# Patient Record
Sex: Male | Born: 1935 | Race: Black or African American | Hispanic: No | Marital: Single
Health system: Southern US, Community
[De-identification: ages and names within clinical notes are randomized; demographics above are authoritative.]

## PROBLEM LIST (undated history)

## (undated) DIAGNOSIS — R1310 Dysphagia, unspecified: Secondary | ICD-10-CM

## (undated) DIAGNOSIS — I10 Essential (primary) hypertension: Secondary | ICD-10-CM

## (undated) DIAGNOSIS — K219 Gastro-esophageal reflux disease without esophagitis: Secondary | ICD-10-CM

## (undated) DIAGNOSIS — F0390 Unspecified dementia without behavioral disturbance: Secondary | ICD-10-CM

## (undated) DIAGNOSIS — Z931 Gastrostomy status: Secondary | ICD-10-CM

## (undated) DIAGNOSIS — N390 Urinary tract infection, site not specified: Secondary | ICD-10-CM

## (undated) DIAGNOSIS — I2510 Atherosclerotic heart disease of native coronary artery without angina pectoris: Secondary | ICD-10-CM

---

## 2014-04-24 ENCOUNTER — Ambulatory Visit: Payer: Self-pay

## 2015-08-26 ENCOUNTER — Emergency Department (HOSPITAL_COMMUNITY): Payer: Medicare Other

## 2015-08-26 ENCOUNTER — Observation Stay (HOSPITAL_COMMUNITY): Payer: Medicare Other

## 2015-08-26 ENCOUNTER — Encounter (HOSPITAL_COMMUNITY): Payer: Self-pay | Admitting: *Deleted

## 2015-08-26 ENCOUNTER — Inpatient Hospital Stay (HOSPITAL_COMMUNITY)
Admission: EM | Admit: 2015-08-26 | Discharge: 2015-09-03 | DRG: 871 | Disposition: A | Discharge status: Skilled Nursing Facility | Payer: Medicare Other | Attending: Internal Medicine | Admitting: Internal Medicine

## 2015-08-26 DIAGNOSIS — R571 Hypovolemic shock: Secondary | ICD-10-CM | POA: Diagnosis present

## 2015-08-26 DIAGNOSIS — K922 Gastrointestinal hemorrhage, unspecified: Secondary | ICD-10-CM | POA: Diagnosis present

## 2015-08-26 DIAGNOSIS — N179 Acute kidney failure, unspecified: Secondary | ICD-10-CM | POA: Diagnosis present

## 2015-08-26 DIAGNOSIS — E876 Hypokalemia: Secondary | ICD-10-CM | POA: Diagnosis present

## 2015-08-26 DIAGNOSIS — R778 Other specified abnormalities of plasma proteins: Secondary | ICD-10-CM

## 2015-08-26 DIAGNOSIS — A419 Sepsis, unspecified organism: Secondary | ICD-10-CM | POA: Diagnosis present

## 2015-08-26 DIAGNOSIS — I129 Hypertensive chronic kidney disease with stage 1 through stage 4 chronic kidney disease, or unspecified chronic kidney disease: Secondary | ICD-10-CM | POA: Diagnosis present

## 2015-08-26 DIAGNOSIS — Z681 Body mass index (BMI) 19 or less, adult: Secondary | ICD-10-CM

## 2015-08-26 DIAGNOSIS — E872 Acidosis, unspecified: Secondary | ICD-10-CM

## 2015-08-26 DIAGNOSIS — F0390 Unspecified dementia without behavioral disturbance: Secondary | ICD-10-CM | POA: Diagnosis present

## 2015-08-26 DIAGNOSIS — I959 Hypotension, unspecified: Secondary | ICD-10-CM | POA: Diagnosis present

## 2015-08-26 DIAGNOSIS — N390 Urinary tract infection, site not specified: Secondary | ICD-10-CM | POA: Diagnosis present

## 2015-08-26 DIAGNOSIS — L89152 Pressure ulcer of sacral region, stage 2: Secondary | ICD-10-CM | POA: Diagnosis present

## 2015-08-26 DIAGNOSIS — L89022 Pressure ulcer of left elbow, stage 2: Secondary | ICD-10-CM | POA: Diagnosis present

## 2015-08-26 DIAGNOSIS — T501X5A Adverse effect of loop [high-ceiling] diuretics, initial encounter: Secondary | ICD-10-CM | POA: Diagnosis present

## 2015-08-26 DIAGNOSIS — J90 Pleural effusion, not elsewhere classified: Secondary | ICD-10-CM

## 2015-08-26 DIAGNOSIS — D649 Anemia, unspecified: Secondary | ICD-10-CM | POA: Diagnosis not present

## 2015-08-26 DIAGNOSIS — Z66 Do not resuscitate: Secondary | ICD-10-CM | POA: Diagnosis present

## 2015-08-26 DIAGNOSIS — N189 Chronic kidney disease, unspecified: Secondary | ICD-10-CM | POA: Diagnosis present

## 2015-08-26 DIAGNOSIS — R7989 Other specified abnormal findings of blood chemistry: Secondary | ICD-10-CM

## 2015-08-26 DIAGNOSIS — I2510 Atherosclerotic heart disease of native coronary artery without angina pectoris: Secondary | ICD-10-CM | POA: Diagnosis present

## 2015-08-26 DIAGNOSIS — L89012 Pressure ulcer of right elbow, stage 2: Secondary | ICD-10-CM | POA: Diagnosis present

## 2015-08-26 DIAGNOSIS — E440 Moderate protein-calorie malnutrition: Secondary | ICD-10-CM | POA: Diagnosis present

## 2015-08-26 DIAGNOSIS — Z7401 Bed confinement status: Secondary | ICD-10-CM | POA: Diagnosis not present

## 2015-08-26 DIAGNOSIS — J948 Other specified pleural conditions: Secondary | ICD-10-CM | POA: Diagnosis present

## 2015-08-26 DIAGNOSIS — E875 Hyperkalemia: Secondary | ICD-10-CM | POA: Diagnosis present

## 2015-08-26 DIAGNOSIS — Z9049 Acquired absence of other specified parts of digestive tract: Secondary | ICD-10-CM | POA: Diagnosis not present

## 2015-08-26 DIAGNOSIS — J939 Pneumothorax, unspecified: Secondary | ICD-10-CM | POA: Diagnosis present

## 2015-08-26 DIAGNOSIS — D62 Acute posthemorrhagic anemia: Secondary | ICD-10-CM | POA: Diagnosis present

## 2015-08-26 DIAGNOSIS — E860 Dehydration: Secondary | ICD-10-CM | POA: Diagnosis present

## 2015-08-26 DIAGNOSIS — Z931 Gastrostomy status: Secondary | ICD-10-CM | POA: Diagnosis not present

## 2015-08-26 DIAGNOSIS — K219 Gastro-esophageal reflux disease without esophagitis: Secondary | ICD-10-CM | POA: Diagnosis present

## 2015-08-26 DIAGNOSIS — L8990 Pressure ulcer of unspecified site, unspecified stage: Secondary | ICD-10-CM | POA: Diagnosis present

## 2015-08-26 HISTORY — DX: Gastrostomy status: Z93.1

## 2015-08-26 HISTORY — DX: Urinary tract infection, site not specified: N39.0

## 2015-08-26 HISTORY — DX: Atherosclerotic heart disease of native coronary artery without angina pectoris: I25.10

## 2015-08-26 HISTORY — DX: Gastro-esophageal reflux disease without esophagitis: K21.9

## 2015-08-26 HISTORY — DX: Dysphagia, unspecified: R13.10

## 2015-08-26 HISTORY — DX: Essential (primary) hypertension: I10

## 2015-08-26 HISTORY — DX: Unspecified dementia, unspecified severity, without behavioral disturbance, psychotic disturbance, mood disturbance, and anxiety: F03.90

## 2015-08-26 LAB — CBC WITH DIFFERENTIAL/PLATELET
Basophils Absolute: 0 10*3/uL (ref 0.0–0.1)
Basophils Relative: 0 %
EOS PCT: 0 %
Eosinophils Absolute: 0 10*3/uL (ref 0.0–0.7)
HEMATOCRIT: 14 % — AB (ref 39.0–52.0)
Hemoglobin: 4.5 g/dL — CL (ref 13.0–17.0)
LYMPHS ABS: 0.8 10*3/uL (ref 0.7–4.0)
Lymphocytes Relative: 6 %
MCH: 26.9 pg (ref 26.0–34.0)
MCHC: 32.1 g/dL (ref 30.0–36.0)
MCV: 83.8 fL (ref 78.0–100.0)
MONO ABS: 1.4 10*3/uL — AB (ref 0.1–1.0)
MONOS PCT: 11 %
NEUTROS ABS: 10.7 10*3/uL — AB (ref 1.7–7.7)
Neutrophils Relative %: 83 %
PLATELETS: 260 10*3/uL (ref 150–400)
RBC: 1.67 MIL/uL — ABNORMAL LOW (ref 4.22–5.81)
RDW: 17.9 % — AB (ref 11.5–15.5)
WBC: 12.9 10*3/uL — AB (ref 4.0–10.5)

## 2015-08-26 LAB — COMPREHENSIVE METABOLIC PANEL
ALT: 23 U/L (ref 17–63)
ANION GAP: 17 — AB (ref 5–15)
AST: 43 U/L — ABNORMAL HIGH (ref 15–41)
Albumin: 2.6 g/dL — ABNORMAL LOW (ref 3.5–5.0)
Alkaline Phosphatase: 60 U/L (ref 38–126)
BILIRUBIN TOTAL: 0.7 mg/dL (ref 0.3–1.2)
BUN: 103 mg/dL — AB (ref 6–20)
CHLORIDE: 96 mmol/L — AB (ref 101–111)
CO2: 22 mmol/L (ref 22–32)
Calcium: 8.1 mg/dL — ABNORMAL LOW (ref 8.9–10.3)
Creatinine, Ser: 3.03 mg/dL — ABNORMAL HIGH (ref 0.61–1.24)
GFR, EST AFRICAN AMERICAN: 20 mL/min — AB (ref >60)
GFR, EST NON AFRICAN AMERICAN: 17 mL/min — AB (ref >60)
Glucose, Bld: 183 mg/dL — ABNORMAL HIGH (ref 65–99)
POTASSIUM: 6.3 mmol/L — AB (ref 3.5–5.1)
Sodium: 135 mmol/L (ref 135–145)
TOTAL PROTEIN: 5.5 g/dL — AB (ref 6.5–8.1)

## 2015-08-26 LAB — I-STAT CG4 LACTIC ACID, ED
LACTIC ACID, VENOUS: 7.04 mmol/L — AB (ref 0.5–2.0)
Lactic Acid, Venous: 9.35 mmol/L (ref 0.5–2.0)

## 2015-08-26 LAB — URINE MICROSCOPIC-ADD ON

## 2015-08-26 LAB — URINALYSIS, ROUTINE W REFLEX MICROSCOPIC
BILIRUBIN URINE: NEGATIVE
GLUCOSE, UA: NEGATIVE mg/dL
Ketones, ur: NEGATIVE mg/dL
Nitrite: NEGATIVE
Protein, ur: 100 mg/dL — AB
SPECIFIC GRAVITY, URINE: 1.01 (ref 1.005–1.030)
pH: 6.5 (ref 5.0–8.0)

## 2015-08-26 LAB — ABO/RH: ABO/RH(D): A POS

## 2015-08-26 LAB — BRAIN NATRIURETIC PEPTIDE: B Natriuretic Peptide: 2548 pg/mL — ABNORMAL HIGH (ref 0.0–100.0)

## 2015-08-26 LAB — PREPARE RBC (CROSSMATCH)

## 2015-08-26 LAB — I-STAT TROPONIN, ED: TROPONIN I, POC: 0.62 ng/mL — AB (ref 0.00–0.08)

## 2015-08-26 LAB — POC OCCULT BLOOD, ED: FECAL OCCULT BLD: POSITIVE — AB

## 2015-08-26 LAB — LIPASE, BLOOD: LIPASE: 17 U/L (ref 11–51)

## 2015-08-26 MED ORDER — PANTOPRAZOLE SODIUM 40 MG IV SOLR
40.0000 mg | Freq: Two times a day (BID) | INTRAVENOUS | Status: DC
  Administered 2015-08-30 – 2015-09-03 (×9): 40 mg via INTRAVENOUS
  Filled 2015-08-26 (×9): qty 40

## 2015-08-26 MED ORDER — BACITRACIN ZINC 500 UNIT/GM EX OINT
TOPICAL_OINTMENT | CUTANEOUS | Status: AC
  Administered 2015-08-26: 23:00:00
  Filled 2015-08-26: qty 0.9

## 2015-08-26 MED ORDER — SODIUM CHLORIDE 0.9 % IV BOLUS (SEPSIS)
1000.0000 mL | Freq: Once | INTRAVENOUS | Status: AC
  Administered 2015-08-26: 1000 mL via INTRAVENOUS

## 2015-08-26 MED ORDER — SODIUM CHLORIDE 0.9 % IV SOLN
10.0000 mL/h | Freq: Once | INTRAVENOUS | Status: AC
  Administered 2015-08-26: 10 mL/h via INTRAVENOUS

## 2015-08-26 MED ORDER — SODIUM CHLORIDE 0.9 % IV SOLN
INTRAVENOUS | Status: DC
  Administered 2015-08-27 – 2015-08-30 (×3): via INTRAVENOUS

## 2015-08-26 MED ORDER — PIPERACILLIN-TAZOBACTAM 3.375 G IVPB 30 MIN
3.3750 g | Freq: Once | INTRAVENOUS | Status: AC
  Administered 2015-08-26: 3.375 g via INTRAVENOUS
  Filled 2015-08-26: qty 50

## 2015-08-26 MED ORDER — PANTOPRAZOLE SODIUM 40 MG IV SOLR
INTRAVENOUS | Status: AC
  Filled 2015-08-26: qty 160

## 2015-08-26 MED ORDER — ONDANSETRON HCL 4 MG PO TABS: 4.0000 mg | ORAL_TABLET | Freq: Four times a day (QID) | ORAL | Status: DC | PRN

## 2015-08-26 MED ORDER — DIATRIZOATE MEGLUMINE & SODIUM 66-10 % PO SOLN
ORAL | Status: AC
  Administered 2015-08-26: 23:00:00
  Filled 2015-08-26: qty 30

## 2015-08-26 MED ORDER — SODIUM CHLORIDE 0.9 % IV BOLUS (SEPSIS): 1000.0000 mL | Freq: Once | INTRAVENOUS | Status: DC

## 2015-08-26 MED ORDER — SODIUM CHLORIDE 0.9 % IV SOLN
8.0000 mg/h | INTRAVENOUS | Status: AC
  Administered 2015-08-27 – 2015-08-28 (×5): 8 mg/h via INTRAVENOUS
  Filled 2015-08-26 (×8): qty 80

## 2015-08-26 MED ORDER — ONDANSETRON HCL 4 MG/2ML IJ SOLN: 4.0000 mg | Freq: Four times a day (QID) | INTRAMUSCULAR | Status: DC | PRN

## 2015-08-26 MED ORDER — SODIUM CHLORIDE 0.9 % IV SOLN
80.0000 mg | Freq: Once | INTRAVENOUS | Status: DC
  Administered 2015-08-27: 80 mg via INTRAVENOUS
  Filled 2015-08-26: qty 80

## 2015-08-26 MED ORDER — VANCOMYCIN HCL IN DEXTROSE 1-5 GM/200ML-% IV SOLN
1000.0000 mg | Freq: Once | INTRAVENOUS | Status: AC
  Administered 2015-08-26: 1000 mg via INTRAVENOUS
  Filled 2015-08-26: qty 200

## 2015-08-26 NOTE — ED Notes (Signed)
CRITICAL VALUE ALERT  Critical value received:  Hgb4.5  Date of notification:  08/26/2015  Time of notification:  2011  Critical value read back:Yes.    Nurse who received alert:  Neldon Mc RN  MD notified (1st page):  Dr. Verdie Mosher  Time of first page:  2012

## 2015-08-26 NOTE — ED Provider Notes (Signed)
CSN: [id number]     Arrival date & time 08/26/15  1810 History   First MD Initiated Contact with Patient 08/26/15 1821     Chief Complaint  Patient presents with  . Hypotension     (Consider location/radiation/quality/duration/timing/severity/associated sxs/prior Treatment) HPI Level 5 Caveat due to dementia. 82 year old male with history of dementia, CAD, hypertension and dysphasia with gastrostomy tube who presents with fecal output from his G-tube. History obtained from nursing facility at [facility] or patient comes from. They state that he has been having some G-tube output of tube feedings several days ago. However, over the course of the past 2 days he's been having increased g-tube output and today seems to be of feculent material and blood-tinged he sees. He has had lower blood pressures and decreased alertness from baseline. No appreciable fevers, nausea or vomiting, coughing or difficulty breathing. According to nursing facility. Patient is full code. Past Medical History  Diagnosis Date  . Hypertension   . Coronary artery disease   . Dementia   . GERD (gastroesophageal reflux disease)   . UTI (lower urinary tract infection)   . Gastrostomy in place [facility])   . Dysphagia    History reviewed. No pertinent past surgical history. History reviewed. No pertinent family history. Social History  Substance Use Topics  . Smoking status: Unknown If Ever Smoked  . Smokeless tobacco: None  . Alcohol Use: No    Review of Systems  Unable to perform ROS: Dementia      Allergies  Review of patient's allergies indicates no known allergies.  Home Medications   Prior to Admission medications   Medication Sig Start Date End Date Taking? Authorizing Provider  collagenase (SANTYL) ointment Apply 1 application topically daily.   Yes Historical Provider, MD  LORazepam (ATIVAN) 1 MG tablet Take 1 mg by mouth 2 (two) times daily. For agitation   Yes Historical Provider, MD  morphine 10  MG/5ML solution Take 5 mg by mouth every 4 (four) hours as needed for moderate pain or severe pain.   Yes Historical Provider, MD  Nutritional Supplements (FEEDING SUPPLEMENT, GLUCERNA 1.2 CAL,) LIQD Place 50 mLs into feeding tube continuous. 46ml/hr every shift for supplement   Yes Historical Provider, MD  OXYGEN Inhale 2 L into the lungs continuous.   Yes Historical Provider, MD  scopolamine (TRANSDERM-SCOP, 1.5 MG,) 1 MG/3DAYS Place 1 patch onto the skin See admin instructions. Apply every 72 hours as needed for for secretions   Yes Historical Provider, MD   BP 134/87 mmHg  Pulse 86  Temp(Src) 96.7 F (35.9 C) (Axillary)  Resp 19  Ht  (1.676 m)  Wt 114 lb 10.2 oz (52 kg)  BMI 18.51 kg/m2  SpO2 100% Physical Exam Physical Exam  Nursing note and vitals reviewed. Constitutional: Chronically and acutely ill appearing, cachectic Head: Normocephalic and atraumatic.  Mouth/Throat: Oropharynx is dry.  Neck:  Neck supple.  Cardiovascular: Tachycardic rate and regular rhythm.  No edema. Pulmonary/Chest: Effort normal. Diminished breath sounds over the right lung. Abdominal: Soft. G-tube in LUQ, with dark brown and blood tinge leakage around tube. in GU: in place suprapubic catheter.   Clear tube feeds when aspirated from G-tubee. There is no rebound and no guarding.  Musculoskeletal: No deformities  Neurological: Lethargic, does not rouse easily to voice but arouses to tactile stimulation, non-verbal, withdraws all extremities to tactile stimuli Skin: Skin is warm and cool. Healing decubitus ulcer.     ED Course  Procedures (including critical care  time) Labs Review Labs Reviewed  MRSA PCR SCREENING - Abnormal; Notable for the following:    MRSA by PCR POSITIVE (*)    All other components within normal limits  URINALYSIS, ROUTINE W REFLEX MICROSCOPIC (NOT AT [facility]) - Abnormal; Notable for the following:    APPearance HAZY (*)    Hgb urine dipstick TRACE (*)    Protein, ur 100 (*)     Leukocytes, UA LARGE (*)    All other components within normal limits  CBC WITH DIFFERENTIAL/PLATELET - Abnormal; Notable for the following:    WBC 12.9 (*)    RBC 1.67 (*)    Hemoglobin 4.5 (*)    HCT 14.0 (*)    RDW 17.9 (*)    Neutro Abs 10.7 (*)    Monocytes Absolute 1.4 (*)    All other components within normal limits  COMPREHENSIVE METABOLIC PANEL - Abnormal; Notable for the following:    Potassium 6.3 (*)    Chloride 96 (*)    Glucose, Bld 183 (*)    BUN 103 (*)    Creatinine, Ser 3.03 (*)    Calcium 8.1 (*)    Total Protein 5.5 (*)    Albumin 2.6 (*)    AST 43 (*)    GFR calc non Af Amer 17 (*)    GFR calc Af Amer 20 (*)    Anion gap 17 (*)    All other components within normal limits  BRAIN NATRIURETIC PEPTIDE - Abnormal; Notable for the following:    B Natriuretic Peptide 2548.0 (*)    All other components within normal limits  URINE MICROSCOPIC-ADD ON - Abnormal; Notable for the following:    Squamous Epithelial / LPF 0-5 (*)    Bacteria, UA FEW (*)    All other components within normal limits  CBC - Abnormal; Notable for the following:    WBC 14.6 (*)    RBC 3.09 (*)    Hemoglobin 9.0 (*)    HCT 26.4 (*)    RDW 16.3 (*)    All other components within normal limits  COMPREHENSIVE METABOLIC PANEL - Abnormal; Notable for the following:    Potassium 6.2 (*)    Chloride 100 (*)    CO2 20 (*)    Glucose, Bld 157 (*)    BUN 102 (*)    Creatinine, Ser 2.93 (*)    Calcium 8.0 (*)    Total Protein 5.5 (*)    Albumin 2.7 (*)    AST 199 (*)    ALT 104 (*)    Total Bilirubin 2.1 (*)    GFR calc non Af Amer 18 (*)    GFR calc Af Amer 21 (*)    Anion gap 16 (*)    All other components within normal limits  LACTIC ACID, PLASMA - Abnormal; Notable for the following:    Lactic Acid, Venous 4.6 (*)    All other components within normal limits  LACTIC ACID, PLASMA - Abnormal; Notable for the following:    Lactic Acid, Venous 2.3 (*)    All other components  within normal limits  I-STAT CG4 LACTIC ACID, ED - Abnormal; Notable for the following:    Lactic Acid, Venous 7.04 (*)    All other components within normal limits  I-STAT TROPOININ, ED - Abnormal; Notable for the following:    Troponin i, poc 0.62 (*)    All other components within normal limits  POC OCCULT BLOOD, ED - Abnormal; Notable for  the following:    Fecal Occult Bld POSITIVE (*)    All other components within normal limits  I-STAT CG4 LACTIC ACID, ED - Abnormal; Notable for the following:    Lactic Acid, Venous 9.35 (*)    All other components within normal limits  CULTURE, BLOOD (ROUTINE X 2)  CULTURE, BLOOD (ROUTINE X 2)  URINE CULTURE  LIPASE, BLOOD  OCCULT BLOOD X 1 CARD TO LAB, STOOL  TYPE AND SCREEN  PREPARE RBC (CROSSMATCH)  ABO/RH    Imaging Review Ct Abdomen Pelvis Wo Contrast  08/26/2015  CLINICAL DATA:  Severe lactic acidosis. Hypotension. Elevated creatinine. Initial encounter. EXAM: CT ABDOMEN AND PELVIS WITHOUT CONTRAST TECHNIQUE: Multidetector CT imaging of the abdomen and pelvis was performed following the standard protocol without IV contrast. COMPARISON:  Chest in two views abdomen earlier today. FINDINGS: The study is somewhat limited due to difficulty positioning the patient. The patient has a large right pleural effusion and right pneumothorax. No left effusion is identified. Heart size is enlarged. There is diffuse body wall and mesenteric edema. A gastrostomy tube is in place. No free intraperitoneal air, portal venous gas or pneumatosis is identified. There is a large volume of stool in the colon. The stomach and small bowel are unremarkable. The patient is status post cholecystectomy. The liver, spleen, adrenal glands and biliary tree are unremarkable. The kidneys appear atrophic bilaterally. Low attenuating lesions in the left kidney are likely cysts. Linear high attenuation focus in the right kidney could be a nonobstructing stone or vascular  calcification. There is marked degenerative disease about the hips and multilevel lumbar spondylosis. No lytic or sclerotic lesion is identified. IMPRESSION: Large right hydropneumothorax. No CT evidence of bowel ischemia is identified. Anasarca. Extensive atherosclerosis. Critical Value/emergent results were called by telephone at the time of interpretation on 08/26/2015 at 11:14 pm to Dr. Crista Curb , who verbally acknowledged these results. Electronically Signed   By: Drusilla Kanner M.D.   On: 08/26/2015 23:14   Dg Abd Acute W/chest  08/26/2015  CLINICAL DATA:  Hypotensive, blood and feces around feeding tube for several days, contracted, hypertension, coronary artery disease, dementia EXAM: DG ABDOMEN ACUTE W/ 1V CHEST COMPARISON:  None. FINDINGS: Enlargement of cardiac silhouette. Elongation of thoracic aorta. LEFT lung clear. Opacification of inferior RIGHT hemi thorax by large probable RIGHT pleural effusion. Significant atelectasis of RIGHT lung. Areas of lucency in the RIGHT hemi thorax may represent loculated pneumothorax or large bullae. Gastrostomy tube mid abdomen. Gas collection under LEFT hemidiaphragm likely represents combination of gas within the stomach and gaseous distention of the distal transverse colon and splenic flexure. No free intraperitoneal air. Paucity of small bowel gas. Diffuse osseous demineralization with degenerative changes of the lumbar spine. Degenerative changes of both hip joints. Scattered vascular calcifications. Questionable nonobstructing RIGHT renal calculus 6 mm diameter. Surgical clips RIGHT upper quadrant question cholecystectomy. IMPRESSION: No definite evidence of bowel obstruction or perforation. Nonspecific gaseous distention of the distal transverse colon and splenic flexure. Large RIGHT pleural effusion with loculated gas collections in the RIGHT hemi thorax which may represent loculated pneumothoraces or less likely bullae; no evidence of mediastinal shift to  suggest tension. RIGHT chest could be better evaluated by CT. Question nonobstructing RIGHT renal calculus. Question prior cholecystectomy. Critical Value/emergent results were called by telephone at the time of interpretation on 08/26/2015 at 7:04 pm to Dr. Crista Curb , who verbally acknowledged these results. Electronically Signed   By: Ulyses Southward M.D.   On: 08/26/2015  19:05   I have personally reviewed and evaluated these images and lab results as part of my medical decision-making.  CRITICAL CARE Performed by: Lavera Guise   Total critical care time: 35 minutes  Critical care time was exclusive of separately billable procedures and treating other patients.  Critical care was necessary to treat or prevent imminent or life-threatening deterioration.  Critical care was time spent personally by me on the following activities: development of treatment plan with patient and/or surrogate as well as nursing, discussions with consultants, evaluation of patient's response to treatment, examination of patient, obtaining history from patient or surrogate, ordering and performing treatments and interventions, ordering and review of laboratory studies, ordering and review of radiographic studies, pulse oximetry and re-evaluation of patient's condition.  MDM   Final diagnoses:  UGIB (upper gastrointestinal bleed)  Acute renal failure, unspecified acute renal failure type (HCC)  Lactic acidosis  Acute blood loss anemia  Hypovolemic shock (HCC)  Pleural effusion on right  Elevated troponin    82 year old with history of dementia and CAD who presents with bloody g-tube drainage and hypotension. Hypotensive on arrival and tachycardic, SBP 80s, responsive to IV fluids. Afebrile and on room air without respiratory distress. Abdomen benign to palpation. Clear material from aspiration of g-tube, rectal exam revealing guaiac positive brown stool.  Blood work concerning for multi-organ failure, with severe  lactic acidosis, troponin leak, and acute renal failure. May be due to hypovolemia from possible GI bleed vs dehydration. Hgb is 4.5 and transfused 2 units of blood. Started on protonix gtt. Empirically covered for sepsis pending cultures, urine, and CXR given severely elevated lactic and leukocytosis, with vanc and zosyn. CXR showing hydroPTX of unclear origin and no prior chest imaging to compare.   Discussed with Dr. Darrick Penna from GI who will evaluate patient. Discussed with Dr. Sharl Ma who will admit stepdown.    Lavera Guise, MD 08/27/15 [phone]

## 2015-08-26 NOTE — H&P (Signed)
PCP:   No primary care provider on file.   Chief Complaint:  Hypotension  HPI: 82 year old male who   has a past medical history of Hypertension; Coronary artery disease; Dementia; GERD (gastroesophageal reflux disease); UTI (lower urinary tract infection); Gastrostomy in place [facility]); and Dysphagia. Patient resides at skilled facility at [facility], and was sent to the ED for hypotension and increased G-tube output for the past few days. G-tube output seemed to have feculent material and blood-tinged discharge. Patient also was found to be hypotensive. Patient unable to provide any history due to dementia. But as per the ED notes there was no fever no nausea vomiting no shortness of breath. In the ED patient was found to be anemic with hemoglobin 4.5. Stool guaiac was positive. Lactate initial 7.04, repeat lactate 9.35 Abdominal x-ray shows no free intraperitoneal air.  Allergies:  No Known Allergies    Past Medical History  Diagnosis Date  . Hypertension   . Coronary artery disease   . Dementia   . GERD (gastroesophageal reflux disease)   . UTI (lower urinary tract infection)   . Gastrostomy in place [facility])   . Dysphagia     History reviewed. No pertinent past surgical history.  Prior to Admission medications   Medication Sig Start Date End Date Taking? Authorizing Provider  collagenase (SANTYL) ointment Apply 1 application topically daily.   Yes Historical Provider, MD  LORazepam (ATIVAN) 1 MG tablet Take 1 mg by mouth 2 (two) times daily. For agitation   Yes Historical Provider, MD  morphine 10 MG/5ML solution Take 5 mg by mouth every 4 (four) hours as needed for moderate pain or severe pain.   Yes Historical Provider, MD  Nutritional Supplements (FEEDING SUPPLEMENT, GLUCERNA 1.2 CAL,) LIQD Place 50 mLs into feeding tube continuous. 15ml/hr every shift for supplement   Yes Historical Provider, MD  OXYGEN Inhale 2 L into the lungs continuous.   Yes Historical Provider, MD    scopolamine (TRANSDERM-SCOP, 1.5 MG,) 1 MG/3DAYS Place 1 patch onto the skin See admin instructions. Apply every 72 hours as needed for for secretions   Yes Historical Provider, MD    Social History:  reports that he does not drink alcohol. His tobacco and drug histories are not on file.  All the positives are listed in BOLD  Review of Systems:  As the history of present illness, rest of review of systems unobtainable   Physical Exam: Blood pressure 108/64, pulse 99, temperature 96.9 F (36.1 C), temperature source Rectal, resp. rate 28, SpO2 100 %. Constitutional:   Patient is a cachectic appearing male Head: Normocephalic and atraumatic Mouth: Mucus membranes moist Neck: Supple, No Thyromegaly Cardiovascular: RRR, S1 normal, S2 normal Pulmonary/Chest: CTAB, no wheezes, rales, or rhonchi Abdominal: Soft. Non-tender, mild guarding, non-distended, PEG tube in place, feculent material noted around the PEG tube  Neurological: Alert, not oriented 3. Moving all extremities  Extremities : No Cyanosis, Clubbing or Edema  Labs on Admission:  Basic Metabolic Panel:  Recent Labs Lab 08/26/15 1950  NA 135  K 6.3*  CL 96*  CO2 22  GLUCOSE 183*  BUN 103*  CREATININE 3.03*  CALCIUM 8.1*   Liver Function Tests:  Recent Labs Lab 08/26/15 1950  AST 43*  ALT 23  ALKPHOS 60  BILITOT 0.7  PROT 5.5*  ALBUMIN 2.6*    Recent Labs Lab 08/26/15 1950  LIPASE 17   No results for input(s): AMMONIA in the last 168 hours. CBC:  Recent Labs  Lab 08/26/15 1950  WBC 12.9*  NEUTROABS 10.7*  HGB 4.5*  HCT 14.0*  MCV 83.8  PLT 260   Cardiac Enzymes: No results for input(s): CKTOTAL, CKMB, CKMBINDEX, TROPONINI in the last 168 hours.  BNP (last 3 results)  Recent Labs  08/26/15 1950  BNP 2548.0*     Radiological Exams on Admission: Dg Abd Acute W/chest  08/26/2015  CLINICAL DATA:  Hypotensive, blood and feces around feeding tube for several days, contracted,  hypertension, coronary artery disease, dementia EXAM: DG ABDOMEN ACUTE W/ 1V CHEST COMPARISON:  None. FINDINGS: Enlargement of cardiac silhouette. Elongation of thoracic aorta. LEFT lung clear. Opacification of inferior RIGHT hemi thorax by large probable RIGHT pleural effusion. Significant atelectasis of RIGHT lung. Areas of lucency in the RIGHT hemi thorax may represent loculated pneumothorax or large bullae. Gastrostomy tube mid abdomen. Gas collection under LEFT hemidiaphragm likely represents combination of gas within the stomach and gaseous distention of the distal transverse colon and splenic flexure. No free intraperitoneal air. Paucity of small bowel gas. Diffuse osseous demineralization with degenerative changes of the lumbar spine. Degenerative changes of both hip joints. Scattered vascular calcifications. Questionable nonobstructing RIGHT renal calculus 6 mm diameter. Surgical clips RIGHT upper quadrant question cholecystectomy. IMPRESSION: No definite evidence of bowel obstruction or perforation. Nonspecific gaseous distention of the distal transverse colon and splenic flexure. Large RIGHT pleural effusion with loculated gas collections in the RIGHT hemi thorax which may represent loculated pneumothoraces or less likely bullae; no evidence of mediastinal shift to suggest tension. RIGHT chest could be better evaluated by CT. Question nonobstructing RIGHT renal calculus. Question prior cholecystectomy. Critical Value/emergent results were called by telephone at the time of interpretation on 08/26/2015 at 7:04 pm to Dr. Crista Curb , who verbally acknowledged these results. Electronically Signed   By: Ulyses Southward M.D.   On: 08/26/2015 19:05       Assessment/Plan Active Problems:   GI bleed   Sepsis (HCC)   Dementia   Anemia   Hyperkalemia   UTI  GI bleed Patient is presenting with guaiac-positive stool, anemia with hemoglobin 4.5 Started on Protonix infusion, 2 units of blood transfusion  ordered in the ED GI has been consulted Dr. Darrick Penna to see the patient  Anemia Likely from above, hemoglobin is 4.5 Unknown baseline Will transfuse 2 units PRBC and check CBC in a.m.  Sepsis Patient presented with hypotension, WBC 12.9 Lactate 9.35 Empirically started on vancomycin and Zosyn for possible UTI Follow urine culture  and blood culture results  Hyperkalemia Potassium 6.3, will not give Kayexalate due to hypotension and ongoing diarrhea from PEG tube Follow BMP in a.m.  Dementia  Stable, no behavior disturbance at this time    Code status: Full code  Family discussion: No family present at bedside   Time Spent on Admission: 55 min  Isis Costanza S [facility] Pager: [phone] 08/26/2015, 10:00 PM  If 7PM-7AM, please contact night-coverage  [email or web address]  Password [email or web address]

## 2015-08-26 NOTE — ED Notes (Signed)
CRITICAL VALUE ALERT  Critical value received:  Potassium 6.3  Date of notification:  08/26/15  Time of notification:  2034 hrs  Critical value read back:Yes.    Nurse who received alert:  Y. Daleen Squibb, RN  MD notified (1st page):  Dr. Verdie Mosher  Time of first page:  2035 hrs   Responding MD:  Dr. Verdie Mosher  Time MD responded:  2037 hrs

## 2015-08-26 NOTE — ED Notes (Signed)
Pt came in with foley catheter in place.  

## 2015-08-26 NOTE — ED Notes (Signed)
Pt from [facility], pt hypotensive and reported that feces and blood from around tube feeding

## 2015-08-27 DIAGNOSIS — J939 Pneumothorax, unspecified: Secondary | ICD-10-CM | POA: Diagnosis present

## 2015-08-27 DIAGNOSIS — N179 Acute kidney failure, unspecified: Secondary | ICD-10-CM

## 2015-08-27 DIAGNOSIS — E875 Hyperkalemia: Secondary | ICD-10-CM | POA: Diagnosis present

## 2015-08-27 DIAGNOSIS — K922 Gastrointestinal hemorrhage, unspecified: Secondary | ICD-10-CM

## 2015-08-27 DIAGNOSIS — E440 Moderate protein-calorie malnutrition: Secondary | ICD-10-CM

## 2015-08-27 DIAGNOSIS — D62 Acute posthemorrhagic anemia: Secondary | ICD-10-CM | POA: Diagnosis present

## 2015-08-27 DIAGNOSIS — Z931 Gastrostomy status: Secondary | ICD-10-CM

## 2015-08-27 DIAGNOSIS — F0390 Unspecified dementia without behavioral disturbance: Secondary | ICD-10-CM

## 2015-08-27 LAB — COMPREHENSIVE METABOLIC PANEL
ALBUMIN: 2.7 g/dL — AB (ref 3.5–5.0)
ALK PHOS: 65 U/L (ref 38–126)
ALT: 104 U/L — AB (ref 17–63)
AST: 199 U/L — AB (ref 15–41)
Anion gap: 16 — ABNORMAL HIGH (ref 5–15)
BILIRUBIN TOTAL: 2.1 mg/dL — AB (ref 0.3–1.2)
BUN: 102 mg/dL — AB (ref 6–20)
CO2: 20 mmol/L — ABNORMAL LOW (ref 22–32)
CREATININE: 2.93 mg/dL — AB (ref 0.61–1.24)
Calcium: 8 mg/dL — ABNORMAL LOW (ref 8.9–10.3)
Chloride: 100 mmol/L — ABNORMAL LOW (ref 101–111)
GFR calc Af Amer: 21 mL/min — ABNORMAL LOW (ref >60)
GFR calc non Af Amer: 18 mL/min — ABNORMAL LOW (ref >60)
GLUCOSE: 157 mg/dL — AB (ref 65–99)
POTASSIUM: 6.2 mmol/L — AB (ref 3.5–5.1)
Sodium: 136 mmol/L (ref 135–145)
TOTAL PROTEIN: 5.5 g/dL — AB (ref 6.5–8.1)

## 2015-08-27 LAB — CBC
HEMATOCRIT: 26.4 % — AB (ref 39.0–52.0)
HEMOGLOBIN: 9 g/dL — AB (ref 13.0–17.0)
MCH: 29.1 pg (ref 26.0–34.0)
MCHC: 34.1 g/dL (ref 30.0–36.0)
MCV: 85.4 fL (ref 78.0–100.0)
Platelets: 196 10*3/uL (ref 150–400)
RBC: 3.09 MIL/uL — AB (ref 4.22–5.81)
RDW: 16.3 % — ABNORMAL HIGH (ref 11.5–15.5)
WBC: 14.6 10*3/uL — AB (ref 4.0–10.5)

## 2015-08-27 LAB — LACTIC ACID, PLASMA
Lactic Acid, Venous: 4.6 mmol/L (ref 0.5–2.0)
Lactic Acid, Venous: 2.3 mmol/L (ref 0.5–2.0)

## 2015-08-27 LAB — MRSA PCR SCREENING: MRSA by PCR: POSITIVE — AB

## 2015-08-27 MED ORDER — SODIUM POLYSTYRENE SULFONATE 15 GM/60ML PO SUSP
30.0000 g | Freq: Once | ORAL | Status: AC
  Administered 2015-08-27: 30 g
  Filled 2015-08-27: qty 120

## 2015-08-27 MED ORDER — SODIUM CHLORIDE 0.9 % IV BOLUS (SEPSIS)
1000.0000 mL | Freq: Once | INTRAVENOUS | Status: AC
  Administered 2015-08-27: 1000 mL via INTRAVENOUS

## 2015-08-27 MED ORDER — MUPIROCIN 2 % EX OINT
1.0000 "application " | TOPICAL_OINTMENT | Freq: Two times a day (BID) | CUTANEOUS | Status: AC
  Administered 2015-08-27 – 2015-08-31 (×10): 1 via NASAL
  Filled 2015-08-27 (×2): qty 22

## 2015-08-27 MED ORDER — CETYLPYRIDINIUM CHLORIDE 0.05 % MT LIQD
7.0000 mL | Freq: Two times a day (BID) | OROMUCOSAL | Status: DC
  Administered 2015-08-27 – 2015-09-03 (×14): 7 mL via OROMUCOSAL

## 2015-08-27 MED ORDER — PIPERACILLIN-TAZOBACTAM IN DEX 2-0.25 GM/50ML IV SOLN
2.2500 g | Freq: Three times a day (TID) | INTRAVENOUS | Status: DC
  Filled 2015-08-27 (×4): qty 50

## 2015-08-27 MED ORDER — FUROSEMIDE 10 MG/ML IJ SOLN
40.0000 mg | Freq: Once | INTRAMUSCULAR | Status: AC
  Administered 2015-08-27: 40 mg via INTRAVENOUS
  Filled 2015-08-27: qty 4

## 2015-08-27 MED ORDER — CHLORHEXIDINE GLUCONATE 0.12 % MT SOLN
15.0000 mL | Freq: Two times a day (BID) | OROMUCOSAL | Status: DC
  Administered 2015-08-27 – 2015-09-03 (×13): 15 mL via OROMUCOSAL
  Filled 2015-08-27 (×13): qty 15

## 2015-08-27 MED ORDER — PIPERACILLIN SOD-TAZOBACTAM SO 2.25 (2-0.25) G IV SOLR
2.2500 g | Freq: Three times a day (TID) | INTRAVENOUS | Status: DC
  Administered 2015-08-27 – 2015-08-31 (×13): 2.25 g via INTRAVENOUS
  Filled 2015-08-27 (×16): qty 2.25

## 2015-08-27 MED ORDER — CHLORHEXIDINE GLUCONATE CLOTH 2 % EX PADS
6.0000 | MEDICATED_PAD | Freq: Every day | CUTANEOUS | Status: AC
  Administered 2015-08-27 – 2015-08-31 (×5): 6 via TOPICAL

## 2015-08-27 MED ORDER — PIPERACILLIN-TAZOBACTAM 3.375 G IVPB
3.3750 g | Freq: Once | INTRAVENOUS | Status: AC
  Administered 2015-08-27: 3.375 g via INTRAVENOUS
  Filled 2015-08-27: qty 50

## 2015-08-27 MED ORDER — VANCOMYCIN HCL IN DEXTROSE 750-5 MG/150ML-% IV SOLN
750.0000 mg | INTRAVENOUS | Status: DC
  Administered 2015-08-28: 750 mg via INTRAVENOUS
  Filled 2015-08-27 (×2): qty 150

## 2015-08-27 NOTE — Progress Notes (Signed)
Difficult to provide mouth care patient tightens lips together.

## 2015-08-27 NOTE — Consult Note (Signed)
Full note to follow. Patient seen and examined. Discussed with Dr.Memon. We have limited information available. This is an 82 year old who came from a nursing home with GI bleeding. He has severe dementia and requires chronic feeding tube and was noted to have essentially complete opacification of his right lung with some question of a pneumothorax in addition. He is not a candidate for any sort of invasive procedure at this point.  He apparently is a ward of the state. I agree that we should not do invasive treatments on this elderly debilitated demented man and I would like to transition him to comfort care if possible and change his CODE STATUS to DO NOT RESUSCITATE

## 2015-08-27 NOTE — Consult Note (Signed)
Reason for Consult: UGI bleed Referring Physician: Hospitalist  Dylan Walsh is an 82 y.o. male. Hx obtained from RN in ICU and records. Patient is unable to give hx. HPI: Patient is a 82 yr old male resident of the [facility]. Hx of dementia and is a ward of the state. Per RN in ICU, he is a full code. Admitted thru the ED yesterday. There was no fever, SOB or nausea associated with his symptoms.  Apparently at NH, he was noted to have feces and blood around his feeding tube. On arrival to the ED he was hypotensive. Hemoglobin of 4.5 on admission. He has received 2 units of PRBCs. Hemoglobin 9.0 after transfusions. Patient has a suprapubic catheter in place. Hx of dysphagia and has a feeding tube in place.   Past Medical History  Diagnosis Date  . Hypertension   . Coronary artery disease   . Dementia   . GERD (gastroesophageal reflux disease)   . UTI (lower urinary tract infection)   . Gastrostomy in place [facility])   . Dysphagia     History reviewed. No pertinent past surgical history.  History reviewed. No pertinent family history.  Social History:  reports that he does not drink alcohol. His tobacco and drug histories are not on file.  Allergies: No Known Allergies  Medications: I have reviewed the patient's current medications.  Results for orders placed or performed during the hospital encounter of 08/26/15 (from the past 48 hour(s))  CBC with Differential     Status: Abnormal   Collection Time: 08/26/15  7:50 PM  Result Value Ref Range   WBC 12.9 (H) 4.0 - 10.5 K/uL   RBC 1.67 (L) 4.22 - 5.81 MIL/uL   Hemoglobin 4.5 (LL) 13.0 - 17.0 g/dL    Comment: REPEATED TO VERIFY CRITICAL RESULT CALLED TO, READ BACK BY AND VERIFIED WITH: TALBOT T AT 2010 ON [id number] BY FORSYTH K    HCT 14.0 (L) 39.0 - 52.0 %   MCV 83.8 78.0 - 100.0 fL   MCH 26.9 26.0 - 34.0 pg   MCHC 32.1 30.0 - 36.0 g/dL   RDW 17.9 (H) 11.5 - 15.5 %   Platelets 260 150 - 400 K/uL   Neutrophils Relative % 83 %    Lymphocytes Relative 6 %   Monocytes Relative 11 %   Eosinophils Relative 0 %   Basophils Relative 0 %   Neutro Abs 10.7 (H) 1.7 - 7.7 K/uL   Lymphs Abs 0.8 0.7 - 4.0 K/uL   Monocytes Absolute 1.4 (H) 0.1 - 1.0 K/uL   Eosinophils Absolute 0.0 0.0 - 0.7 K/uL   Basophils Absolute 0.0 0.0 - 0.1 K/uL   RBC Morphology ELLIPTOCYTES     Comment: SPHEROCYTES ROULEAUX   Comprehensive metabolic panel     Status: Abnormal   Collection Time: 08/26/15  7:50 PM  Result Value Ref Range   Sodium 135 135 - 145 mmol/L   Potassium 6.3 (HH) 3.5 - 5.1 mmol/L    Comment: CRITICAL RESULT CALLED TO, READ BACK BY AND VERIFIED WITH: WALL,E AT 2035 ON 08/26/2015 BY ISLEY,B    Chloride 96 (L) 101 - 111 mmol/L   CO2 22 22 - 32 mmol/L   Glucose, Bld 183 (H) 65 - 99 mg/dL   BUN 103 (H) 6 - 20 mg/dL   Creatinine, Ser 3.03 (H) 0.61 - 1.24 mg/dL   Calcium 8.1 (L) 8.9 - 10.3 mg/dL   Total Protein 5.5 (L) 6.5 - 8.1 g/dL  Albumin 2.6 (L) 3.5 - 5.0 g/dL   AST 43 (H) 15 - 41 U/L   ALT 23 17 - 63 U/L   Alkaline Phosphatase 60 38 - 126 U/L   Total Bilirubin 0.7 0.3 - 1.2 mg/dL   GFR calc non Af Amer 17 (L) >60 mL/min   GFR calc Af Amer 20 (L) >60 mL/min    Comment: (NOTE) The eGFR has been calculated using the CKD EPI equation. This calculation has not been validated in all clinical situations. eGFR's persistently <60 mL/min signify possible Chronic Kidney Disease.    Anion gap 17 (H) 5 - 15  Lipase, blood     Status: None   Collection Time: 08/26/15  7:50 PM  Result Value Ref Range   Lipase 17 11 - 51 U/L  Brain natriuretic peptide     Status: Abnormal   Collection Time: 08/26/15  7:50 PM  Result Value Ref Range   B Natriuretic Peptide 2548.0 (H) 0.0 - 100.0 pg/mL  Urinalysis, Routine w reflex microscopic (not at [facility])     Status: Abnormal   Collection Time: 08/26/15  8:00 PM  Result Value Ref Range   Color, Urine YELLOW YELLOW   APPearance HAZY (A) CLEAR   Specific Gravity, Urine 1.010 1.005 -  1.030   pH 6.5 5.0 - 8.0   Glucose, UA NEGATIVE NEGATIVE mg/dL   Hgb urine dipstick TRACE (A) NEGATIVE   Bilirubin Urine NEGATIVE NEGATIVE   Ketones, ur NEGATIVE NEGATIVE mg/dL   Protein, ur 100 (A) NEGATIVE mg/dL   Nitrite NEGATIVE NEGATIVE   Leukocytes, UA LARGE (A) NEGATIVE  Urine microscopic-add on     Status: Abnormal   Collection Time: 08/26/15  8:00 PM  Result Value Ref Range   Squamous Epithelial / LPF 0-5 (A) NONE SEEN   WBC, UA 6-30 0 - 5 WBC/hpf   RBC / HPF 0-5 0 - 5 RBC/hpf   Bacteria, UA FEW (A) NONE SEEN   Urine-Other YEAST PRESENT   I-Stat Troponin, ED (not at [facility])     Status: Abnormal   Collection Time: 08/26/15  8:02 PM  Result Value Ref Range   Troponin i, poc 0.62 (HH) 0.00 - 0.08 ng/mL   Comment NOTIFIED PHYSICIAN    Comment 3            Comment: Due to the release kinetics of cTnI, a negative result within the first hours of the onset of symptoms does not rule out myocardial infarction with certainty. If myocardial infarction is still suspected, repeat the test at appropriate intervals.   I-Stat CG4 Lactic Acid, ED     Status: Abnormal   Collection Time: 08/26/15  8:04 PM  Result Value Ref Range   Lactic Acid, Venous 7.04 (HH) 0.5 - 2.0 mmol/L   Comment NOTIFIED PHYSICIAN   Blood culture (routine x 2)     Status: None (Preliminary result)   Collection Time: 08/26/15  8:20 PM  Result Value Ref Range   Specimen Description BLOOD LEFT HAND    Special Requests BOTTLES DRAWN AEROBIC ONLY [place]    Culture PENDING    Report Status PENDING   POC occult blood, ED     Status: Abnormal   Collection Time: 08/26/15  8:29 PM  Result Value Ref Range   Fecal Occult Bld POSITIVE (A) NEGATIVE  Type and screen [facility]     Status: None (Preliminary result)   Collection Time: 08/26/15  8:30 PM  Result Value Ref Range  ABO/RH(D) A POS    Antibody Screen NEG    Sample Expiration 08/29/2015    Unit Number [id number]    Blood Component Type RED CELLS,LR     Unit division 00    Status of Unit ISSUED    Transfusion Status OK TO TRANSFUSE    Crossmatch Result Compatible    Unit Number [id number]    Blood Component Type RED CELLS,LR    Unit division 00    Status of Unit ISSUED,FINAL    Transfusion Status OK TO TRANSFUSE    Crossmatch Result Compatible   Prepare RBC     Status: None   Collection Time: 08/26/15  8:30 PM  Result Value Ref Range   Order Confirmation ORDER PROCESSED BY BLOOD BANK   ABO/Rh     Status: None   Collection Time: 08/26/15  8:30 PM  Result Value Ref Range   ABO/RH(D) A POS   Blood culture (routine x 2)     Status: None (Preliminary result)   Collection Time: 08/26/15  8:35 PM  Result Value Ref Range   Specimen Description BLOOD LEFT HAND    Special Requests BOTTLES DRAWN AEROBIC ONLY 6CC    Culture PENDING    Report Status PENDING   I-Stat CG4 Lactic Acid, ED     Status: Abnormal   Collection Time: 08/26/15  9:23 PM  Result Value Ref Range   Lactic Acid, Venous 9.35 (HH) 0.5 - 2.0 mmol/L   Comment NOTIFIED PHYSICIAN   MRSA PCR Screening     Status: Abnormal   Collection Time: 08/26/15 11:30 PM  Result Value Ref Range   MRSA by PCR POSITIVE (A) NEGATIVE    Comment:        The GeneXpert MRSA Assay (FDA approved for NASAL specimens only), is one component of a comprehensive MRSA colonization surveillance program. It is not intended to diagnose MRSA infection nor to guide or monitor treatment for MRSA infections. RESULT CALLED TO, READ BACK BY AND VERIFIED WITH: HAMMOCK S AT 0315 ON [id number] BY FORSYTH K   CBC     Status: Abnormal   Collection Time: 08/27/15  4:44 AM  Result Value Ref Range   WBC 14.6 (H) 4.0 - 10.5 K/uL   RBC 3.09 (L) 4.22 - 5.81 MIL/uL   Hemoglobin 9.0 (L) 13.0 - 17.0 g/dL    Comment: DELTA CHECK NOTED   HCT 26.4 (L) 39.0 - 52.0 %   MCV 85.4 78.0 - 100.0 fL   MCH 29.1 26.0 - 34.0 pg   MCHC 34.1 30.0 - 36.0 g/dL   RDW 16.3 (H) 11.5 - 15.5 %   Platelets 196 150 - 400 K/uL   Comprehensive metabolic panel     Status: Abnormal   Collection Time: 08/27/15  4:44 AM  Result Value Ref Range   Sodium 136 135 - 145 mmol/L   Potassium 6.2 (HH) 3.5 - 5.1 mmol/L    Comment: CRITICAL RESULT CALLED TO, READ BACK BY AND VERIFIED WITH: DANIELS,J AT 6:25AM ON 08/27/15 BY FESTERMAN,C    Chloride 100 (L) 101 - 111 mmol/L   CO2 20 (L) 22 - 32 mmol/L   Glucose, Bld 157 (H) 65 - 99 mg/dL   BUN 102 (H) 6 - 20 mg/dL   Creatinine, Ser 2.93 (H) 0.61 - 1.24 mg/dL   Calcium 8.0 (L) 8.9 - 10.3 mg/dL   Total Protein 5.5 (L) 6.5 - 8.1 g/dL   Albumin 2.7 (L) 3.5 - 5.0 g/dL   AST 199 (  H) 15 - 41 U/L   ALT 104 (H) 17 - 63 U/L   Alkaline Phosphatase 65 38 - 126 U/L   Total Bilirubin 2.1 (H) 0.3 - 1.2 mg/dL   GFR calc non Af Amer 18 (L) >60 mL/min   GFR calc Af Amer 21 (L) >60 mL/min    Comment: (NOTE) The eGFR has been calculated using the CKD EPI equation. This calculation has not been validated in all clinical situations. eGFR's persistently <60 mL/min signify possible Chronic Kidney Disease.    Anion gap 16 (H) 5 - 15  Lactic acid, plasma     Status: Abnormal   Collection Time: 08/27/15  4:44 AM  Result Value Ref Range   Lactic Acid, Venous 4.6 (HH) 0.5 - 2.0 mmol/L    Comment: CRITICAL RESULT CALLED TO, READ BACK BY AND VERIFIED WITH: HAMMOCK,S AT 6:25AM ON 08/27/15 BY FESTERMAN,C       ROS Blood pressure 154/91, pulse 96, temperature 97.2 F (36.2 C), temperature source Axillary, resp. rate 22, height _0  (1.676 m), weight 114 lb 10.2 oz (52 kg), SpO2 99 %. Physical Exam Moaing in room. Lungs are clear. Abdomen is soft. Feeding tube in place. I did not removed dressing. Clear fluid in feeding tube noted. Multiple dressing in place for pressure sores. Edema to both arms noted.   Assessment/Plan:  Possible UGI bleed. He has received 2 units of blood. I will discuss with Dr. Laural Golden. Agree with Protonix BID dosing.   SETZER,TERRI W 08/27/2015, 8:19 AM    GI  attending note: Patient examined. Ration unable to provide any history other than mumbling when addressed. Abdominal exam reveals gastrostomy tube in place along with suprapubic Foley's catheter. Abdomen is soft and without any masses. Rectal examination reveals very large and firm prostate. Liquid stool noted on gloved finger. Patient has received 2 units of PRBCs and myoglobin has increased from 4.5 g 29.0. He also has acute on chronic kidney injury. He has right hydropneumothorax. Patient has advanced dementia and cared for at [facility] and he does not have any family member living.  He possibly blood from upper GI tract but there is no evidence of active bleeding as evidenced by clear gastric return. He could also be bleeding from small bowel or proximal colon. Will empirically treat him for peptic ulcer disease. If bleeding continues and there is need for more blood transfusion will consider diagnostic evaluation. Given multiple comorbidities prognosis is poor and supportive therapy and comfort measures may be the best option. Patient will be reevaluated in a.m and consider resuming gastric feeding. Will check INR with next blood draw.

## 2015-08-27 NOTE — Progress Notes (Signed)
CRITICAL VALUE ALERT  Critical value received:  Positive MRSA PCR  Date of notification:  08/27/2015  Time of notification:  0404  Critical value read back:Yes.    Nurse who received alert:  Lionel December, RN  MD notified (1st page):  R Onalee Hua  Time of first page:  404  MD notified (2nd page):  Time of second page:  Responding MD:  Vania Rea  Time MD responded:  [phone]

## 2015-08-27 NOTE — Progress Notes (Signed)
ANTIBIOTIC CONSULT NOTE-Preliminary  Pharmacy Consult for Vancomycin and Zosyn Indication: Sepsis  No Known Allergies  Patient Measurements: Height:  (167.6 cm) Weight: 114 lb 10.2 oz (52 kg) IBW/kg (Calculated) : 63.8   Vital Signs: Temp: 96.8 F (36 C) (01/25 0215) Temp Source: Axillary (01/25 0215) BP: 120/62 mmHg (01/25 0215) Pulse Rate: 94 (01/25 0215)  Labs:  Recent Labs  08/26/15 1950  WBC 12.9*  HGB 4.5*  PLT 260  CREATININE 3.03*    Estimated Creatinine Clearance: 12.4 mL/min (by C-G formula based on Cr of 3.03).  No results for input(s): VANCOTROUGH, VANCOPEAK, VANCORANDOM, GENTTROUGH, GENTPEAK, GENTRANDOM, TOBRATROUGH, TOBRAPEAK, TOBRARND, AMIKACINPEAK, AMIKACINTROU, AMIKACIN in the last 72 hours.   Microbiology: Recent Results (from the past 720 hour(s))  Blood culture (routine x 2)     Status: None (Preliminary result)   Collection Time: 08/26/15  8:20 PM  Result Value Ref Range Status   Specimen Description BLOOD LEFT HAND  Final   Special Requests BOTTLES DRAWN AEROBIC ONLY 6CC  Final   Culture PENDING  Incomplete   Report Status PENDING  Incomplete  Blood culture (routine x 2)     Status: None (Preliminary result)   Collection Time: 08/26/15  8:35 PM  Result Value Ref Range Status   Specimen Description BLOOD LEFT HAND  Final   Special Requests BOTTLES DRAWN AEROBIC ONLY 6CC  Final   Culture PENDING  Incomplete   Report Status PENDING  Incomplete    Medical History: Past Medical History  Diagnosis Date  . Hypertension   . Coronary artery disease   . Dementia   . GERD (gastroesophageal reflux disease)   . UTI (lower urinary tract infection)   . Gastrostomy in place [facility])   . Dysphagia     Medications:  Vancomycin 1 Gm IV given in the ED at 2015 08/26/15 Zosyn 3.375 Gm IV given in the Ed at 2015 08/26/15  Assessment: 82 yo male, resident of SNF brought to the ED for hypotension and increased G-tube output. Pt has elevated WBC.  G-tube output appeared blood tinged, with feculent material. Pt to be started on antibiotics empirically for sepsis.  Goal of Therapy:  Vancomycin troughs 15-20 mcg/ml Eradicate infection  Plan:  Preliminary review of pertinent patient information completed.  Protocol will be initiated with a one-time dose of Zosyn 3.375 gm IV  hours after the initial Ed dose.  Jeani Hawking clinical pharmacist will complete review during morning rounds to assess patient and finalize treatment regimen.  Arelia Sneddon, [facility] 08/27/2015,2:26 AM

## 2015-08-27 NOTE — Care Management Note (Signed)
Case Management Note  Patient Details  Name: Dylan Walsh MRN: [id number] Date of Birth: February 15, 1925  Subjective/Objective:                  Pt admitted with sepsis. Pt is from [facility]. Anticipate pt will return to [facility] at [place]. CSW is aware and will arrange for return to facility if/when appropriate.   Action/Plan: No CM needs anticipated.   Expected Discharge Date:   09/01/2015               Expected Discharge Plan:  Skilled Nursing Facility  In-House Referral:  Clinical Social Work  Discharge planning Services  CM Consult  Post Acute Care Choice:  NA Choice offered to:  NA  DME Arranged:    DME Agency:     HH Arranged:    HH Agency:     Status of Service:  Completed, signed off  Medicare Important Message Given:    Date Medicare IM Given:    Medicare IM give by:    Date Additional Medicare IM Given:    Additional Medicare Important Message give by:     If discussed at Long Length of Stay Meetings, dates discussed:    Additional Comments:  Malcolm Metro, RN 08/27/2015, 1:02 PM

## 2015-08-27 NOTE — Progress Notes (Signed)
CSW contacted [facility] where patient was residing- they report that Vara Guardian [phone]) is the DSS Guardian of patient- message left for her to return my call- have also shared info with MD.    Reece Levy, MSW, Theresia Majors  [phone]

## 2015-08-27 NOTE — Progress Notes (Signed)
ANTIBIOTIC CONSULT NOTE-  Pharmacy Consult for Vancomycin and Zosyn Indication: Sepsis  No Known Allergies  Patient Measurements: Height:  (167.6 cm) Weight: 114 lb 10.2 oz (52 kg) IBW/kg (Calculated) : 63.8   Vital Signs: Temp: 97.2 F (36.2 C) (01/25 0748) Temp Source: Axillary (01/25 0748) BP: 134/87 mmHg (01/25 0900) Pulse Rate: 86 (01/25 0900)  Labs:  Recent Labs  08/26/15 1950 08/27/15 0444  WBC 12.9* 14.6*  HGB 4.5* 9.0*  PLT 260 196  CREATININE 3.03* 2.93*    Estimated Creatinine Clearance: 12.8 mL/min (by C-G formula based on Cr of 2.93).  No results for input(s): VANCOTROUGH, VANCOPEAK, VANCORANDOM, GENTTROUGH, GENTPEAK, GENTRANDOM, TOBRATROUGH, TOBRAPEAK, TOBRARND, AMIKACINPEAK, AMIKACINTROU, AMIKACIN in the last 72 hours.   Microbiology: Recent Results (from the past 720 hour(s))  Blood culture (routine x 2)     Status: None (Preliminary result)   Collection Time: 08/26/15  8:20 PM  Result Value Ref Range Status   Specimen Description BLOOD LEFT HAND  Final   Special Requests BOTTLES DRAWN AEROBIC ONLY 6CC  Final   Culture NO GROWTH < 24 HOURS  Final   Report Status PENDING  Incomplete  Blood culture (routine x 2)     Status: None (Preliminary result)   Collection Time: 08/26/15  8:35 PM  Result Value Ref Range Status   Specimen Description BLOOD LEFT HAND  Final   Special Requests BOTTLES DRAWN AEROBIC ONLY 6CC  Final   Culture NO GROWTH < 24 HOURS  Final   Report Status PENDING  Incomplete  MRSA PCR Screening     Status: Abnormal   Collection Time: 08/26/15 11:30 PM  Result Value Ref Range Status   MRSA by PCR POSITIVE (A) NEGATIVE Final    Comment:        The GeneXpert MRSA Assay (FDA approved for NASAL specimens only), is one component of a comprehensive MRSA colonization surveillance program. It is not intended to diagnose MRSA infection nor to guide or monitor treatment for MRSA infections. RESULT CALLED TO, READ BACK BY AND  VERIFIED WITH: HAMMOCK S AT 0315 ON [id number] BY FORSYTH K     Medical History: Past Medical History  Diagnosis Date  . Hypertension   . Coronary artery disease   . Dementia   . GERD (gastroesophageal reflux disease)   . UTI (lower urinary tract infection)   . Gastrostomy in place [facility])   . Dysphagia    ABX given on admission:    Vancomycin 1 Gm IV given in the ED at 2015 08/26/15 Zosyn 3.375 Gm IV given in the Ed at 2015 08/26/15  Assessment: 82 yo male, resident of SNF brought to the ED for hypotension and increased G-tube output. Pt has elevated WBC. G-tube output appeared blood tinged, with feculent material. Pt to be started on antibiotics empirically for sepsis.  Goal of Therapy:  Vancomycin troughs 15-20 mcg/ml Eradicate infection  Plan - Maintenance doses of ABX: Vancomycin  IV q48hrs Check trough at steady state Zosyn 2.25gm IV q8h Monitor labs, renal fxn, progress and c/s  Valrie Hart A, RPH 08/27/2015,11:13 AM

## 2015-08-27 NOTE — Progress Notes (Signed)
[facility] PROGRESS NOTE  Dylan Walsh ZOX:[id number] DOB: 09-Jun-1925 DOA: 08/26/2015 PCP: No primary care provider on file.  Assessment/Plan: 1. Upper GI bleed with guaiac positive stool coming out of PEG tube. GI has been consulted. Hgb 9.0 following transfusion. Will continue Protonix BID. 2. ABLA, Hgb 4.5 on admission. Baseline Hgb is unclear. Will try and request records from [facility]. Currently 9.0 today following transfusion of 2U PRBCs. Will continue to monitor and transfuse as needed.  3. Possible UTI. Chronic suprapubic catheter.  UC pending. Will continue empiric abx and IVF. Lactic acid trending down, WBC 14.6.  4. Large right hydropneumothorax, revealed on abdominal CT. Patient does not appear to be in any respiratory distress at this time. Due to his advanced age and multiple medical problems, he is not a good candidate for surgical intervention. May need to continue observation. Will consult pulmonology.  5. Hyperkalemia, will give a dose of kayexalate.  6. Acute renal failure, likely superimposed on some element of CKD. He has evidence of anasarca on CT imaging and also has atrophic kidneys. Will consult nephrology.  7. Dementia, appears to be at baseline.  8. Hypotension, patient initially hypotensive on admission which has resolved. Initially there was a concern for sepsis and he was started on broad spectrum abx and IV hydration. Blood cultures have been sent and will be followed up. Hypotension may be related to severe anemia and dehydration. Lactic acid trending down with IVF. Continue IV abx for now with low threshold to discontinue if no clear source of infection emerges.   Code Status: Full DVT prophylaxis: SCDs Family Communication: Patient is ward of state. Will need to consult social services to discuss code status. Due to his age and multiple medical problems, patient should be a DNR.  Disposition Plan: Continue to monitor in ICU.     Consultants:  GI  Pulmonology  Nephrology   Procedures:  Transfused 2U PRBCs 1/24  Antibiotics:  Zosyn 1/24>>  Vancomycin 1/24>>  HPI/Subjective: Complains of abdominal pain. History is limited secondary to patients baseline dementia.   Objective: Filed Vitals:   08/27/15 0630 08/27/15 0645  BP: 138/77 154/91  Pulse: 97 96  Temp:    Resp: 29 22    Intake/Output Summary (Last 24 hours) at 08/27/15 0716 Last data filed at 08/27/15 0600  Gross per 24 hour  Intake 1776.25 ml  Output      0 ml  Net 1776.25 ml   Filed Weights   08/26/15 2223 08/27/15 0500  Weight: 52 kg (114 lb 10.2 oz) 52 kg (114 lb 10.2 oz)    Exam:  General: NAD, looks comfortable Cardiovascular: RRR, S1, S2  Respiratory: Diminished on the right side. Rhonchi on left side.   Abdomen: soft, non tender, bowel sounds normal. PEG tube and suprapubic catheter in place.  Musculoskeletal: No edema b/l   Data Reviewed: Basic Metabolic Panel:  Recent Labs Lab 08/26/15 1950 08/27/15 0444  NA 135 136  K 6.3* 6.2*  CL 96* 100*  CO2 22 20*  GLUCOSE 183* 157*  BUN 103* 102*  CREATININE 3.03* 2.93*  CALCIUM 8.1* 8.0*   Liver Function Tests:  Recent Labs Lab 08/26/15 1950 08/27/15 0444  AST 43* 199*  ALT 23 104*  ALKPHOS 60 65  BILITOT 0.7 2.1*  PROT 5.5* 5.5*  ALBUMIN 2.6* 2.7*    Recent Labs Lab 08/26/15 1950  LIPASE 17    CBC:  Recent Labs Lab 08/26/15 1950 08/27/15 0444  WBC 12.9* 14.6*  NEUTROABS 10.7*  --   HGB 4.5* 9.0*  HCT 14.0* 26.4*  MCV 83.8 85.4  PLT 260 196    BNP (last 3 results)  Recent Labs  08/26/15 1950  BNP 2548.0*     Recent Results (from the past 240 hour(s))  Blood culture (routine x 2)     Status: None (Preliminary result)   Collection Time: 08/26/15  8:20 PM  Result Value Ref Range Status   Specimen Description BLOOD LEFT HAND  Final   Special Requests BOTTLES DRAWN AEROBIC ONLY 6CC  Final   Culture PENDING  Incomplete    Report Status PENDING  Incomplete  Blood culture (routine x 2)     Status: None (Preliminary result)   Collection Time: 08/26/15  8:35 PM  Result Value Ref Range Status   Specimen Description BLOOD LEFT HAND  Final   Special Requests BOTTLES DRAWN AEROBIC ONLY 6CC  Final   Culture PENDING  Incomplete   Report Status PENDING  Incomplete  MRSA PCR Screening     Status: Abnormal   Collection Time: 08/26/15 11:30 PM  Result Value Ref Range Status   MRSA by PCR POSITIVE (A) NEGATIVE Final    Comment:        The GeneXpert MRSA Assay (FDA approved for NASAL specimens only), is one component of a comprehensive MRSA colonization surveillance program. It is not intended to diagnose MRSA infection nor to guide or monitor treatment for MRSA infections. RESULT CALLED TO, READ BACK BY AND VERIFIED WITH: HAMMOCK S AT 0315 ON [id number] BY FORSYTH K      Studies: Ct Abdomen Pelvis Wo Contrast  08/26/2015  CLINICAL DATA:  Severe lactic acidosis. Hypotension. Elevated creatinine. Initial encounter. EXAM: CT ABDOMEN AND PELVIS WITHOUT CONTRAST TECHNIQUE: Multidetector CT imaging of the abdomen and pelvis was performed following the standard protocol without IV contrast. COMPARISON:  Chest in two views abdomen earlier today. FINDINGS: The study is somewhat limited due to difficulty positioning the patient. The patient has a large right pleural effusion and right pneumothorax. No left effusion is identified. Heart size is enlarged. There is diffuse body wall and mesenteric edema. A gastrostomy tube is in place. No free intraperitoneal air, portal venous gas or pneumatosis is identified. There is a large volume of stool in the colon. The stomach and small bowel are unremarkable. The patient is status post cholecystectomy. The liver, spleen, adrenal glands and biliary tree are unremarkable. The kidneys appear atrophic bilaterally. Low attenuating lesions in the left kidney are likely cysts. Linear high attenuation  focus in the right kidney could be a nonobstructing stone or vascular calcification. There is marked degenerative disease about the hips and multilevel lumbar spondylosis. No lytic or sclerotic lesion is identified. IMPRESSION: Large right hydropneumothorax. No CT evidence of bowel ischemia is identified. Anasarca. Extensive atherosclerosis. Critical Value/emergent results were called by telephone at the time of interpretation on 08/26/2015 at 11:14 pm to Dr. Crista Curb , who verbally acknowledged these results. Electronically Signed   By: Drusilla Kanner M.D.   On: 08/26/2015 23:14   Dg Abd Acute W/chest  08/26/2015  CLINICAL DATA:  Hypotensive, blood and feces around feeding tube for several days, contracted, hypertension, coronary artery disease, dementia EXAM: DG ABDOMEN ACUTE W/ 1V CHEST COMPARISON:  None. FINDINGS: Enlargement of cardiac silhouette. Elongation of thoracic aorta. LEFT lung clear. Opacification of inferior RIGHT hemi thorax by large probable RIGHT pleural effusion. Significant atelectasis of RIGHT lung. Areas of lucency in the RIGHT hemi thorax  may represent loculated pneumothorax or large bullae. Gastrostomy tube mid abdomen. Gas collection under LEFT hemidiaphragm likely represents combination of gas within the stomach and gaseous distention of the distal transverse colon and splenic flexure. No free intraperitoneal air. Paucity of small bowel gas. Diffuse osseous demineralization with degenerative changes of the lumbar spine. Degenerative changes of both hip joints. Scattered vascular calcifications. Questionable nonobstructing RIGHT renal calculus 6 mm diameter. Surgical clips RIGHT upper quadrant question cholecystectomy. IMPRESSION: No definite evidence of bowel obstruction or perforation. Nonspecific gaseous distention of the distal transverse colon and splenic flexure. Large RIGHT pleural effusion with loculated gas collections in the RIGHT hemi thorax which may represent loculated  pneumothoraces or less likely bullae; no evidence of mediastinal shift to suggest tension. RIGHT chest could be better evaluated by CT. Question nonobstructing RIGHT renal calculus. Question prior cholecystectomy. Critical Value/emergent results were called by telephone at the time of interpretation on 08/26/2015 at 7:04 pm to Dr. Crista Curb , who verbally acknowledged these results. Electronically Signed   By: Ulyses Southward M.D.   On: 08/26/2015 19:05    Scheduled Meds: . antiseptic oral rinse  7 mL Mouth Rinse q12n4p  . chlorhexidine  15 mL Mouth Rinse BID  . Chlorhexidine Gluconate Cloth  6 each Topical Q0600  . mupirocin ointment  1 application Nasal BID  . [START ON 08/30/2015] pantoprazole (PROTONIX) IV  40 mg Intravenous Q12H  . piperacillin-tazobactam (ZOSYN)  IV  3.375 g Intravenous Once   Continuous Infusions: . sodium chloride Stopped (08/26/15 2345)  . pantoprozole (PROTONIX) infusion 8 mg/hr (08/27/15 0600)    Active Problems:   GI bleed   Sepsis (HCC)   Dementia   Anemia   UGIB (upper gastrointestinal bleed)   Time spent: 25 minutes  Gust Eugene. MD [facility] Pager [phone]. If 7PM-7AM, please contact night-coverage at [email or web address], password [facility] 08/27/2015, 7:16 AM  LOS: 1 day     By signing my name below, I, Burnett Harry, attest that this documentation has been prepared under the direction and in the presence of [facility]. MD Electronically Signed: Burnett Harry, Scribe. 08/27/2015 8:48am  I, Dr. Erick Blinks, personally performed the services described in this documentaiton. All medical record entries made by the scribe were at my direction and in my presence. I have reviewed the chart and agree that the record reflects my personal performance and is accurate and complete  Erick Blinks, MD, 08/27/2015 9:10 AM

## 2015-08-27 NOTE — NC FL2 (Deleted)
[place] MEDICAID FL2 LEVEL OF CARE SCREENING TOOL     IDENTIFICATION  Patient Name: Dylan Walsh Birthdate: January 03, 1925 Sex: male Admission Date (Current Location): 08/26/2015  [facility] and [place] Number:  [employer] and Address:  [facility],  [address] [place] [address]      Provider Number: [id number]  Attending Physician Name and Address:  Erick Blinks, MD  Relative Name and Phone Number:       Current Level of Care: Hospital Recommended Level of Care: Skilled Nursing Facility Prior Approval Number:    Date Approved/Denied:   PASRR Number:   [id number]   Discharge Plan: SNF    Current Diagnoses: Patient Active Problem List   Diagnosis Date Noted  . Pneumothorax, right 08/27/2015  . Hyperkalemia 08/27/2015  . Acute renal failure (HCC) 08/27/2015  . Acute blood loss anemia 08/27/2015  . GI bleed 08/26/2015  . Sepsis (HCC) 08/26/2015  . Dementia 08/26/2015  . Anemia 08/26/2015  . UGIB (upper gastrointestinal bleed) 08/26/2015    Orientation RESPIRATION BLADDER Height & Weight    Self  O2 (2L) Continent  (167.6 cm) 114 lbs.  BEHAVIORAL SYMPTOMS/MOOD NEUROLOGICAL BOWEL NUTRITION STATUS      Continent Diet  AMBULATORY STATUS COMMUNICATION OF NEEDS Skin   Limited Assist Verbally PU Stage and Appropriate Care, Other (Comment)                       Personal Care Assistance Level of Assistance              Functional Limitations Info             SPECIAL CARE FACTORS FREQUENCY                       Contractures      Additional Factors Info  Code Status Code Status Info: FULL CODE             Current Medications (08/27/2015):  This is the current hospital active medication list Current Facility-Administered Medications  Medication Dose Route Frequency Provider Last Rate Last Dose  . 0.9 %  sodium chloride infusion   Intravenous Continuous Leda Gauze, NP   Stopped at 08/26/15  2345  . antiseptic oral rinse (CPC / CETYLPYRIDINIUM CHLORIDE 0.05%) solution 7 mL  7 mL Mouth Rinse q12n4p Meredeth Ide, MD      . chlorhexidine (PERIDEX) 0.12 % solution 15 mL  15 mL Mouth Rinse BID Meredeth Ide, MD   15 mL at 08/27/15 1042  . Chlorhexidine Gluconate Cloth 2 % PADS 6 each  6 each Topical Q0600 Meredeth Ide, MD   6 each at 08/27/15 0544  . mupirocin ointment (BACTROBAN) 2 % 1 application  1 application Nasal BID Meredeth Ide, MD   1 application at 08/27/15 1043  . ondansetron (ZOFRAN) tablet 4 mg  4 mg Oral Q6H PRN Meredeth Ide, MD       Or  . ondansetron (ZOFRAN) injection 4 mg  4 mg Intravenous Q6H PRN Meredeth Ide, MD      . pantoprazole (PROTONIX) 80 mg in sodium chloride 0.9 % 250 mL (0.32 mg/mL) infusion  8 mg/hr Intravenous Continuous Lavera Guise, MD 25 mL/hr at 08/27/15 0600 8 mg/hr at 08/27/15 0600  . [START ON 08/30/2015] pantoprazole (PROTONIX) injection 40 mg  40 mg Intravenous Q12H Lavera Guise, MD      . piperacillin-tazobactam (  ZOSYN) 2.25 g in dextrose 5 % 50 mL IVPB  2.25 g Intravenous 3 times per day Erick Blinks, MD      . Melene Muller ON 08/28/2015] vancomycin (VANCOCIN) IVPB 750 mg/150 ml premix  750 mg Intravenous Q48H Erick Blinks, MD         Discharge Medications: Please see discharge summary for a list of discharge medications.  Relevant Imaging Results:  Relevant Lab Results:   Additional Information  (SSN [id number])  Liliana Cline, LCSW

## 2015-08-27 NOTE — NC FL2 (Signed)
[place] MEDICAID FL2 LEVEL OF CARE SCREENING TOOL     IDENTIFICATION  Patient Name: Dylan Walsh Birthdate: [birth year] Sex: male Admission Date (Current Location): 08/26/2015  [facility] and [place] Number:  [employer] and Address:  [facility],  [address] [place] [address]      Provider Number: [id number]  Attending Physician Name and Address:  Erick Blinks, MD  Relative Name and Phone Number:       Current Level of Care: Hospital Recommended Level of Care: Skilled Nursing Facility Prior Approval Number:    Date Approved/Denied:   PASRR Number:    Discharge Plan: SNF    Current Diagnoses: Patient Active Problem List   Diagnosis Date Noted  . Pneumothorax, right 08/27/2015  . Hyperkalemia 08/27/2015  . Acute renal failure (HCC) 08/27/2015  . Acute blood loss anemia 08/27/2015  . GI bleed 08/26/2015  . Sepsis (HCC) 08/26/2015  . Dementia 08/26/2015  . Anemia 08/26/2015  . UGIB (upper gastrointestinal bleed) 08/26/2015    Orientation RESPIRATION BLADDER Height & Weight    Self  O2 (2L) Continent  (167.6 cm) 114 lbs.  BEHAVIORAL SYMPTOMS/MOOD NEUROLOGICAL BOWEL NUTRITION STATUS      Continent Diet  AMBULATORY STATUS COMMUNICATION OF NEEDS Skin   Limited Assist Verbally PU Stage and Appropriate Care, Other (Comment)                       Personal Care Assistance Level of Assistance              Functional Limitations Info             SPECIAL CARE FACTORS FREQUENCY                       Contractures      Additional Factors Info  Code Status Code Status Info: FULL CODE             Current Medications (08/27/2015):  This is the current hospital active medication list Current Facility-Administered Medications  Medication Dose Route Frequency Provider Last Rate Last Dose  . 0.9 %  sodium chloride infusion   Intravenous Continuous Leda Gauze, NP   Stopped at 08/26/15 2345  .  antiseptic oral rinse (CPC / CETYLPYRIDINIUM CHLORIDE 0.05%) solution 7 mL  7 mL Mouth Rinse q12n4p Meredeth Ide, MD      . chlorhexidine (PERIDEX) 0.12 % solution 15 mL  15 mL Mouth Rinse BID Meredeth Ide, MD   15 mL at 08/27/15 1042  . Chlorhexidine Gluconate Cloth 2 % PADS 6 each  6 each Topical Q0600 Meredeth Ide, MD   6 each at 08/27/15 0544  . mupirocin ointment (BACTROBAN) 2 % 1 application  1 application Nasal BID Meredeth Ide, MD   1 application at 08/27/15 1043  . ondansetron (ZOFRAN) tablet 4 mg  4 mg Oral Q6H PRN Meredeth Ide, MD       Or  . ondansetron (ZOFRAN) injection 4 mg  4 mg Intravenous Q6H PRN Meredeth Ide, MD      . pantoprazole (PROTONIX) 80 mg in sodium chloride 0.9 % 250 mL (0.32 mg/mL) infusion  8 mg/hr Intravenous Continuous Lavera Guise, MD 25 mL/hr at 08/27/15 0600 8 mg/hr at 08/27/15 0600  . [START ON 08/30/2015] pantoprazole (PROTONIX) injection 40 mg  40 mg Intravenous Q12H Lavera Guise, MD      . piperacillin-tazobactam (ZOSYN) 2.25  g in dextrose 5 % 50 mL IVPB  2.25 g Intravenous 3 times per day Erick Blinks, MD      . Melene Muller ON 08/28/2015] vancomycin (VANCOCIN) IVPB 750 mg/150 ml premix  750 mg Intravenous Q48H Erick Blinks, MD         Discharge Medications: Please see discharge summary for a list of discharge medications.  Relevant Imaging Results:  Relevant Lab Results:   Additional Information  (SSN [id number])  Liliana Cline, LCSW

## 2015-08-27 NOTE — Consult Note (Signed)
Dylan Walsh MRN: [id number] DOB/AGE: 08-17-1924 82 y.o. Primary Care Physician:No primary care provider on file. Admit date: 08/26/2015 Chief Complaint:  Chief Complaint  Patient presents with  . Hypotension   HPI: Pt is 82 year old male with a past medical history of Hypertension who was sent to the Emergency roomr hypotension and increased G-tube output for the past few days  HPI dates back to past few days when staff at Nursing home noted that  G-tube output was increasedes and later it seemed to have feculent material and blood-tinged discharge. Patient was also hypotensive.  In the ER patient was found to be anemic with hemoglobin 4.5 , hypotensive and admitted for further care. Pt has dementia unable to give any hx  Past Medical History  Diagnosis Date  . Hypertension   . Coronary artery disease   . Dementia   . GERD (gastroesophageal reflux disease)   . UTI (lower urinary tract infection)   . Gastrostomy in place [facility])   . Dysphagia         History reviewed. No pertinent family history.  Social History:  reports that he does not drink alcohol. His tobacco and drug histories are not on file. Currently ward of state/Nursing home resident   Allergies: No Known Allergies  Medications Prior to Admission  Medication Sig Dispense Refill  . collagenase (SANTYL) ointment Apply 1 application topically daily.    Marland Kitchen LORazepam (ATIVAN) 1 MG tablet Take 1 mg by mouth 2 (two) times daily. For agitation    . morphine 10 MG/5ML solution Take 5 mg by mouth every 4 (four) hours as needed for moderate pain or severe pain.    . Nutritional Supplements (FEEDING SUPPLEMENT, GLUCERNA 1.2 CAL,) LIQD Place 50 mLs into feeding tube continuous. 82ml/hr every shift for supplement    . OXYGEN Inhale 2 L into the lungs continuous.    Marland Kitchen scopolamine (TRANSDERM-SCOP, 1.5 MG,) 1 MG/3DAYS Place 1 patch onto the skin See admin instructions. Apply every 72 hours as needed for for secretions          WNU:UVOZD from the symptoms mentioned above,there are no other symptoms referable to all systems reviewed.  Marland Kitchen antiseptic oral rinse  7 mL Mouth Rinse q12n4p  . chlorhexidine  15 mL Mouth Rinse BID  . Chlorhexidine Gluconate Cloth  6 each Topical Q0600  . mupirocin ointment  1 application Nasal BID  . [START ON 08/30/2015] pantoprazole (PROTONIX) IV  40 mg Intravenous Q12H  . piperacillin-tazobactam (ZOSYN)  IV  2.25 g Intravenous Q8H  . [START ON 08/28/2015] vancomycin  750 mg Intravenous Q48H         GUY:QIHKVQ to get as pt is not abe to voice any concerns   Physical Exam: Vital signs in last 24 hours: Temp:  [96.7 F (35.9 C)-97.5 F (36.4 C)] 97.2 F (36.2 C) (01/25 0748) Pulse Rate:  [54-106] 86 (01/25 0900) Resp:  [13-32] 19 (01/25 0900) BP: (79-154)/(29-95) 134/87 mmHg (01/25 0900) SpO2:  [73 %-100 %] 100 % (01/25 0900) Weight:  [114 lb 10.2 oz (52 kg)] 114 lb 10.2 oz (52 kg) (01/25 0500) Weight change:  Last BM Date:  (unknown)  Intake/Output from previous day: 01/24 0701 - 01/25 0700 In: 1776.3 [I.V.:391.3; Blood:335; IV Piggyback:1050] Out: -      Physical Exam: General- pt is confused, combative at times Resp- No acute REsp distress, decreased bs much more ion righ than left CVS- S1S2 regular in rate and rhythm GIT- BS+, soft, NT, PEG tube in  situ EXT- NO LE Edema, NO Cyanosis CNS- CN 2-12 grossly intact. Moving all 4 extremities GU- Supra pubic in situ   Lab Results: CBC  Recent Labs  08/26/15 1950 08/27/15 0444  WBC 12.9* 14.6*  HGB 4.5* 9.0*  HCT 14.0* 26.4*  PLT 260 196    BMET  Recent Labs  08/26/15 1950 08/27/15 0444  NA 135 136  K 6.3* 6.2*  CL 96* 100*  CO2 22 20*  GLUCOSE 183* 157*  BUN 103* 102*  CREATININE 3.03* 2.93*  CALCIUM 8.1* 8.0*    MICRO Recent Results (from the past 240 hour(s))  Blood culture (routine x 2)     Status: None (Preliminary result)   Collection Time: 08/26/15  8:20 PM  Result Value  Ref Range Status   Specimen Description BLOOD LEFT HAND  Final   Special Requests BOTTLES DRAWN AEROBIC ONLY 6CC  Final   Culture NO GROWTH < 24 HOURS  Final   Report Status PENDING  Incomplete  Blood culture (routine x 2)     Status: None (Preliminary result)   Collection Time: 08/26/15  8:35 PM  Result Value Ref Range Status   Specimen Description BLOOD LEFT HAND  Final   Special Requests BOTTLES DRAWN AEROBIC ONLY 6CC  Final   Culture NO GROWTH < 24 HOURS  Final   Report Status PENDING  Incomplete  MRSA PCR Screening     Status: Abnormal   Collection Time: 08/26/15 11:30 PM  Result Value Ref Range Status   MRSA by PCR POSITIVE (A) NEGATIVE Final    Comment:        The GeneXpert MRSA Assay (FDA approved for NASAL specimens only), is one component of a comprehensive MRSA colonization surveillance program. It is not intended to diagnose MRSA infection nor to guide or monitor treatment for MRSA infections. RESULT CALLED TO, READ BACK BY AND VERIFIED WITH: HAMMOCK S AT 0315 ON [id number] BY FORSYTH K       Lab Results  Component Value Date   CALCIUM 8.0* 08/27/2015      Impression: 1)Renal  AKI secondary to multiple factors                    Hypovolemia                    Hypotension                     Sepsis                AKI sec to prerenal/ATN                AKI on CKD ( not sure)               CKD stage  Not sure as no data before this admission is available.                Pt doers have CKd as CT scanb shows atrophic kidneys and pt is nearly 82 years old                Will try to get data before this admission   2)CVS-pt hypotensive at the time of admission      Now better  3)Anemia HGb now better    Pt admitted with severe anemia sec to GI bleed    Pt did receive PRBC during this admission   4)ID- pt admitted with sepsis   Pt  on IV ABX    5)CNs- pt with dementia Primary MD following  6)Electrolytes Hyperkalemic   Sec to AKI + most likely CKD +  Acidosis    Pt did receve kayexalate  NOrmonatremic   7)Acid base Co2 not at goal 136-122=14 Alb 2.7 Delta AG 14-10=4 Delta Bicarb  24-20=4 High AG acidosis sec to lactic acidosis    82 Resp-pt with hydropneumothorax    Primary team following    Plan:  Will ask for IV lasix Will ask for FENA Will increase ivf rate Agree with kayexalate Will follow BMet      Vernie Piet S 08/27/2015, 11:18 AM

## 2015-08-28 DIAGNOSIS — L8990 Pressure ulcer of unspecified site, unspecified stage: Secondary | ICD-10-CM | POA: Diagnosis present

## 2015-08-28 LAB — BASIC METABOLIC PANEL
Anion gap: 16 — ABNORMAL HIGH (ref 5–15)
BUN: 99 mg/dL — AB (ref 6–20)
CALCIUM: 8 mg/dL — AB (ref 8.9–10.3)
CO2: 21 mmol/L — ABNORMAL LOW (ref 22–32)
CREATININE: 2.9 mg/dL — AB (ref 0.61–1.24)
Chloride: 106 mmol/L (ref 101–111)
GFR, EST AFRICAN AMERICAN: 21 mL/min — AB (ref >60)
GFR, EST NON AFRICAN AMERICAN: 18 mL/min — AB (ref >60)
Glucose, Bld: 125 mg/dL — ABNORMAL HIGH (ref 65–99)
Potassium: 4.4 mmol/L (ref 3.5–5.1)
SODIUM: 143 mmol/L (ref 135–145)

## 2015-08-28 LAB — PROTIME-INR
INR: 1.28 (ref 0.00–1.49)
PROTHROMBIN TIME: 16.2 s — AB (ref 11.6–15.2)

## 2015-08-28 LAB — TYPE AND SCREEN
ABO/RH(D): A POS
ANTIBODY SCREEN: NEGATIVE
UNIT DIVISION: 0
Unit division: 0

## 2015-08-28 LAB — CBC
HCT: 25.5 % — ABNORMAL LOW (ref 39.0–52.0)
Hemoglobin: 8.7 g/dL — ABNORMAL LOW (ref 13.0–17.0)
MCH: 29.2 pg (ref 26.0–34.0)
MCHC: 34.1 g/dL (ref 30.0–36.0)
MCV: 85.6 fL (ref 78.0–100.0)
PLATELETS: 212 10*3/uL (ref 150–400)
RBC: 2.98 MIL/uL — AB (ref 4.22–5.81)
RDW: 17 % — AB (ref 11.5–15.5)
WBC: 15.3 10*3/uL — AB (ref 4.0–10.5)

## 2015-08-28 LAB — URINE CULTURE

## 2015-08-28 LAB — SODIUM, URINE, RANDOM: Sodium, Ur: 72 mmol/L

## 2015-08-28 LAB — CREATININE, URINE, RANDOM: Creatinine, Urine: 20.83 mg/dL

## 2015-08-28 MED ORDER — JEVITY 1.2 CAL PO LIQD
1000.0000 mL | ORAL | Status: DC
  Filled 2015-08-28: qty 1000

## 2015-08-28 MED ORDER — VITAL HIGH PROTEIN PO LIQD
1000.0000 mL | ORAL | Status: DC
  Filled 2015-08-28 (×2): qty 1000

## 2015-08-28 MED ORDER — POLYETHYLENE GLYCOL 3350 17 G PO PACK
17.0000 g | PACK | Freq: Two times a day (BID) | ORAL | Status: DC
  Administered 2015-08-28 – 2015-09-02 (×9): 17 g via ORAL
  Filled 2015-08-28 (×12): qty 1

## 2015-08-28 MED ORDER — VITAL AF 1.2 CAL PO LIQD
1000.0000 mL | ORAL | Status: DC
  Administered 2015-08-28: 1000 mL
  Filled 2015-08-28 (×6): qty 1000

## 2015-08-28 NOTE — Progress Notes (Signed)
Notified [facility] notified to fax last lab report from center.

## 2015-08-28 NOTE — Progress Notes (Signed)
Subjective: He is more responsive than yesterday. He is still having drainage of what looks like and smells like feculent material from his PEG tube. This may be infected also so I sent it for culture. He's not having any respiratory distress.  Objective: Vital signs in last 24 hours: Temp:  [96.7 F (35.9 C)-97.1 F (36.2 C)] 97 F (36.1 C) (01/26 0400) Pulse Rate:  [86-102] 94 (01/26 0700) Resp:  [15-31] 20 (01/26 0700) BP: (109-158)/(60-92) 152/81 mmHg (01/26 0700) SpO2:  [90 %-100 %] 93 % (01/26 0700) Weight:  [53.7 kg (118 lb 6.2 oz)] 53.7 kg (118 lb 6.2 oz) (01/26 0500) Weight change: 1.7 kg (3 lb 12 oz) Last BM Date:  (unknown)  Intake/Output from previous day: 01/25 0701 - 01/26 0700 In: 915 [I.V.:865; IV Piggyback:50] Out: 1000 [Urine:1000]  PHYSICAL EXAM General appearance: alert and agitated Resp: absent breath sounds on the right Cardio: regular rate and rhythm, S1, S2 normal, no murmur, click, rub or gallop GI: he has drainage from his PEG tube. Extremities: he has multiple skin lesions. No edema  Lab Results:  Results for orders placed or performed during the hospital encounter of 08/26/15 (from the past 48 hour(s))  CBC with Differential     Status: Abnormal   Collection Time: 08/26/15  7:50 PM  Result Value Ref Range   WBC 12.9 (H) 4.0 - 10.5 K/uL   RBC 1.67 (L) 4.22 - 5.81 MIL/uL   Hemoglobin 4.5 (LL) 13.0 - 17.0 g/dL    Comment: REPEATED TO VERIFY CRITICAL RESULT CALLED TO, READ BACK BY AND VERIFIED WITH: TALBOT T AT 2010 ON [id number] BY FORSYTH K    HCT 14.0 (L) 39.0 - 52.0 %   MCV 83.8 78.0 - 100.0 fL   MCH 26.9 26.0 - 34.0 pg   MCHC 32.1 30.0 - 36.0 g/dL   RDW 17.9 (H) 11.5 - 15.5 %   Platelets 260 150 - 400 K/uL   Neutrophils Relative % 83 %   Lymphocytes Relative 6 %   Monocytes Relative 11 %   Eosinophils Relative 0 %   Basophils Relative 0 %   Neutro Abs 10.7 (H) 1.7 - 7.7 K/uL   Lymphs Abs 0.8 0.7 - 4.0 K/uL   Monocytes Absolute 1.4 (H)  0.1 - 1.0 K/uL   Eosinophils Absolute 0.0 0.0 - 0.7 K/uL   Basophils Absolute 0.0 0.0 - 0.1 K/uL   RBC Morphology ELLIPTOCYTES     Comment: SPHEROCYTES ROULEAUX   Comprehensive metabolic panel     Status: Abnormal   Collection Time: 08/26/15  7:50 PM  Result Value Ref Range   Sodium 135 135 - 145 mmol/L   Potassium 6.3 (HH) 3.5 - 5.1 mmol/L    Comment: CRITICAL RESULT CALLED TO, READ BACK BY AND VERIFIED WITH: WALL,E AT 2035 ON 08/26/2015 BY ISLEY,B    Chloride 96 (L) 101 - 111 mmol/L   CO2 22 22 - 32 mmol/L   Glucose, Bld 183 (H) 65 - 99 mg/dL   BUN 103 (H) 6 - 20 mg/dL   Creatinine, Ser 3.03 (H) 0.61 - 1.24 mg/dL   Calcium 8.1 (L) 8.9 - 10.3 mg/dL   Total Protein 5.5 (L) 6.5 - 8.1 g/dL   Albumin 2.6 (L) 3.5 - 5.0 g/dL   AST 43 (H) 15 - 41 U/L   ALT 23 17 - 63 U/L   Alkaline Phosphatase 60 38 - 126 U/L   Total Bilirubin 0.7 0.3 - 1.2 mg/dL   GFR calc  non Af Amer 17 (L) >60 mL/min   GFR calc Af Amer 20 (L) >60 mL/min    Comment: (NOTE) The eGFR has been calculated using the CKD EPI equation. This calculation has not been validated in all clinical situations. eGFR's persistently <60 mL/min signify possible Chronic Kidney Disease.    Anion gap 17 (H) 5 - 15  Lipase, blood     Status: None   Collection Time: 08/26/15  7:50 PM  Result Value Ref Range   Lipase 17 11 - 51 U/L  Brain natriuretic peptide     Status: Abnormal   Collection Time: 08/26/15  7:50 PM  Result Value Ref Range   B Natriuretic Peptide 2548.0 (H) 0.0 - 100.0 pg/mL  Urinalysis, Routine w reflex microscopic (not at [facility])     Status: Abnormal   Collection Time: 08/26/15  8:00 PM  Result Value Ref Range   Color, Urine YELLOW YELLOW   APPearance HAZY (A) CLEAR   Specific Gravity, Urine 1.010 1.005 - 1.030   pH 6.5 5.0 - 8.0   Glucose, UA NEGATIVE NEGATIVE mg/dL   Hgb urine dipstick TRACE (A) NEGATIVE   Bilirubin Urine NEGATIVE NEGATIVE   Ketones, ur NEGATIVE NEGATIVE mg/dL   Protein, ur 100 (A) NEGATIVE  mg/dL   Nitrite NEGATIVE NEGATIVE   Leukocytes, UA LARGE (A) NEGATIVE  Urine microscopic-add on     Status: Abnormal   Collection Time: 08/26/15  8:00 PM  Result Value Ref Range   Squamous Epithelial / LPF 0-5 (A) NONE SEEN   WBC, UA 6-30 0 - 5 WBC/hpf   RBC / HPF 0-5 0 - 5 RBC/hpf   Bacteria, UA FEW (A) NONE SEEN   Urine-Other YEAST PRESENT   I-Stat Troponin, ED (not at [facility])     Status: Abnormal   Collection Time: 08/26/15  8:02 PM  Result Value Ref Range   Troponin i, poc 0.62 (HH) 0.00 - 0.08 ng/mL   Comment NOTIFIED PHYSICIAN    Comment 3            Comment: Due to the release kinetics of cTnI, a negative result within the first hours of the onset of symptoms does not rule out myocardial infarction with certainty. If myocardial infarction is still suspected, repeat the test at appropriate intervals.   I-Stat CG4 Lactic Acid, ED     Status: Abnormal   Collection Time: 08/26/15  8:04 PM  Result Value Ref Range   Lactic Acid, Venous 7.04 (HH) 0.5 - 2.0 mmol/L   Comment NOTIFIED PHYSICIAN   Blood culture (routine x 2)     Status: None (Preliminary result)   Collection Time: 08/26/15  8:20 PM  Result Value Ref Range   Specimen Description BLOOD LEFT HAND    Special Requests BOTTLES DRAWN AEROBIC ONLY 6CC    Culture NO GROWTH < 24 HOURS    Report Status PENDING   POC occult blood, ED     Status: Abnormal   Collection Time: 08/26/15  8:29 PM  Result Value Ref Range   Fecal Occult Bld POSITIVE (A) NEGATIVE  Type and screen [facility]     Status: None (Preliminary result)   Collection Time: 08/26/15  8:30 PM  Result Value Ref Range   ABO/RH(D) A POS    Antibody Screen NEG    Sample Expiration 08/29/2015    Unit Number [id number]    Blood Component Type RED CELLS,LR    Unit division 00    Status of Unit  ISSUED    Transfusion Status OK TO TRANSFUSE    Crossmatch Result Compatible    Unit Number [id number]    Blood Component Type RED CELLS,LR    Unit  division 00    Status of Unit ISSUED,FINAL    Transfusion Status OK TO TRANSFUSE    Crossmatch Result Compatible   Prepare RBC     Status: None   Collection Time: 08/26/15  8:30 PM  Result Value Ref Range   Order Confirmation ORDER PROCESSED BY BLOOD BANK   ABO/Rh     Status: None   Collection Time: 08/26/15  8:30 PM  Result Value Ref Range   ABO/RH(D) A POS   Blood culture (routine x 2)     Status: None (Preliminary result)   Collection Time: 08/26/15  8:35 PM  Result Value Ref Range   Specimen Description BLOOD LEFT HAND    Special Requests BOTTLES DRAWN AEROBIC ONLY 6CC    Culture NO GROWTH < 24 HOURS    Report Status PENDING   I-Stat CG4 Lactic Acid, ED     Status: Abnormal   Collection Time: 08/26/15  9:23 PM  Result Value Ref Range   Lactic Acid, Venous 9.35 (HH) 0.5 - 2.0 mmol/L   Comment NOTIFIED PHYSICIAN   MRSA PCR Screening     Status: Abnormal   Collection Time: 08/26/15 11:30 PM  Result Value Ref Range   MRSA by PCR POSITIVE (A) NEGATIVE    Comment:        The GeneXpert MRSA Assay (FDA approved for NASAL specimens only), is one component of a comprehensive MRSA colonization surveillance program. It is not intended to diagnose MRSA infection nor to guide or monitor treatment for MRSA infections. RESULT CALLED TO, READ BACK BY AND VERIFIED WITH: HAMMOCK S AT 0315 ON [id number] BY FORSYTH K   CBC     Status: Abnormal   Collection Time: 08/27/15  4:44 AM  Result Value Ref Range   WBC 14.6 (H) 4.0 - 10.5 K/uL   RBC 3.09 (L) 4.22 - 5.81 MIL/uL   Hemoglobin 9.0 (L) 13.0 - 17.0 g/dL    Comment: DELTA CHECK NOTED   HCT 26.4 (L) 39.0 - 52.0 %   MCV 85.4 78.0 - 100.0 fL   MCH 29.1 26.0 - 34.0 pg   MCHC 34.1 30.0 - 36.0 g/dL   RDW 16.3 (H) 11.5 - 15.5 %   Platelets 196 150 - 400 K/uL  Comprehensive metabolic panel     Status: Abnormal   Collection Time: 08/27/15  4:44 AM  Result Value Ref Range   Sodium 136 135 - 145 mmol/L   Potassium 6.2 (HH) 3.5 - 5.1 mmol/L     Comment: CRITICAL RESULT CALLED TO, READ BACK BY AND VERIFIED WITH: DANIELS,J AT 6:25AM ON 08/27/15 BY FESTERMAN,C    Chloride 100 (L) 101 - 111 mmol/L   CO2 20 (L) 22 - 32 mmol/L   Glucose, Bld 157 (H) 65 - 99 mg/dL   BUN 102 (H) 6 - 20 mg/dL   Creatinine, Ser 2.93 (H) 0.61 - 1.24 mg/dL   Calcium 8.0 (L) 8.9 - 10.3 mg/dL   Total Protein 5.5 (L) 6.5 - 8.1 g/dL   Albumin 2.7 (L) 3.5 - 5.0 g/dL   AST 199 (H) 15 - 41 U/L   ALT 104 (H) 17 - 63 U/L   Alkaline Phosphatase 65 38 - 126 U/L   Total Bilirubin 2.1 (H) 0.3 - 1.2 mg/dL   GFR calc  non Af Amer 18 (L) >60 mL/min   GFR calc Af Amer 21 (L) >60 mL/min    Comment: (NOTE) The eGFR has been calculated using the CKD EPI equation. This calculation has not been validated in all clinical situations. eGFR's persistently <60 mL/min signify possible Chronic Kidney Disease.    Anion gap 16 (H) 5 - 15  Lactic acid, plasma     Status: Abnormal   Collection Time: 08/27/15  4:44 AM  Result Value Ref Range   Lactic Acid, Venous 4.6 (HH) 0.5 - 2.0 mmol/L    Comment: CRITICAL RESULT CALLED TO, READ BACK BY AND VERIFIED WITH: HAMMOCK,S AT 6:25AM ON 08/27/15 BY FESTERMAN,C   Lactic acid, plasma     Status: Abnormal   Collection Time: 08/27/15  7:56 AM  Result Value Ref Range   Lactic Acid, Venous 2.3 (HH) 0.5 - 2.0 mmol/L    Comment: CRITICAL RESULT CALLED TO, READ BACK BY AND VERIFIED WITH: BROWER,R AT 8:30AM ON 08/27/15 BY FESTERMAN,C   Creatinine, urine, random     Status: None   Collection Time: 08/27/15 11:58 PM  Result Value Ref Range   Creatinine, Urine 20.83 mg/dL  Sodium, urine, random     Status: None   Collection Time: 08/27/15 11:58 PM  Result Value Ref Range   Sodium, Ur 72 mmol/L  Basic metabolic panel     Status: Abnormal   Collection Time: 08/28/15  4:28 AM  Result Value Ref Range   Sodium 143 135 - 145 mmol/L    Comment: DELTA CHECK NOTED   Potassium 4.4 3.5 - 5.1 mmol/L   Chloride 106 101 - 111 mmol/L   CO2 21 (L)  22 - 32 mmol/L   Glucose, Bld 125 (H) 65 - 99 mg/dL   BUN 99 (H) 6 - 20 mg/dL   Creatinine, Ser 2.90 (H) 0.61 - 1.24 mg/dL   Calcium 8.0 (L) 8.9 - 10.3 mg/dL   GFR calc non Af Amer 18 (L) >60 mL/min   GFR calc Af Amer 21 (L) >60 mL/min    Comment: (NOTE) The eGFR has been calculated using the CKD EPI equation. This calculation has not been validated in all clinical situations. eGFR's persistently <60 mL/min signify possible Chronic Kidney Disease.    Anion gap 16 (H) 5 - 15  CBC     Status: Abnormal   Collection Time: 08/28/15  4:28 AM  Result Value Ref Range   WBC 15.3 (H) 4.0 - 10.5 K/uL   RBC 2.98 (L) 4.22 - 5.81 MIL/uL   Hemoglobin 8.7 (L) 13.0 - 17.0 g/dL   HCT 25.5 (L) 39.0 - 52.0 %   MCV 85.6 78.0 - 100.0 fL   MCH 29.2 26.0 - 34.0 pg   MCHC 34.1 30.0 - 36.0 g/dL   RDW 17.0 (H) 11.5 - 15.5 %   Platelets 212 150 - 400 K/uL  Protime-INR     Status: Abnormal   Collection Time: 08/28/15  4:28 AM  Result Value Ref Range   Prothrombin Time 16.2 (H) 11.6 - 15.2 seconds   INR 1.28 0.00 - 1.49    ABGS No results for input(s): PHART, PO2ART, TCO2, HCO3 in the last 72 hours.  Invalid input(s): PCO2 CULTURES Recent Results (from the past 240 hour(s))  Blood culture (routine x 2)     Status: None (Preliminary result)   Collection Time: 08/26/15  8:20 PM  Result Value Ref Range Status   Specimen Description BLOOD LEFT HAND  Final  Special Requests BOTTLES DRAWN AEROBIC ONLY 6CC  Final   Culture NO GROWTH < 24 HOURS  Final   Report Status PENDING  Incomplete  Blood culture (routine x 2)     Status: None (Preliminary result)   Collection Time: 08/26/15  8:35 PM  Result Value Ref Range Status   Specimen Description BLOOD LEFT HAND  Final   Special Requests BOTTLES DRAWN AEROBIC ONLY [place]  Final   Culture NO GROWTH < 24 HOURS  Final   Report Status PENDING  Incomplete  MRSA PCR Screening     Status: Abnormal   Collection Time: 08/26/15 11:30 PM  Result Value Ref Range  Status   MRSA by PCR POSITIVE (A) NEGATIVE Final    Comment:        The GeneXpert MRSA Assay (FDA approved for NASAL specimens only), is one component of a comprehensive MRSA colonization surveillance program. It is not intended to diagnose MRSA infection nor to guide or monitor treatment for MRSA infections. RESULT CALLED TO, READ BACK BY AND VERIFIED WITH: HAMMOCK S AT 0315 ON [id number] BY FORSYTH K    Studies/Results: Ct Abdomen Pelvis Wo Contrast  08/26/2015  CLINICAL DATA:  Severe lactic acidosis. Hypotension. Elevated creatinine. Initial encounter. EXAM: CT ABDOMEN AND PELVIS WITHOUT CONTRAST TECHNIQUE: Multidetector CT imaging of the abdomen and pelvis was performed following the standard protocol without IV contrast. COMPARISON:  Chest in two views abdomen earlier today. FINDINGS: The study is somewhat limited due to difficulty positioning the patient. The patient has a large right pleural effusion and right pneumothorax. No left effusion is identified. Heart size is enlarged. There is diffuse body wall and mesenteric edema. A gastrostomy tube is in place. No free intraperitoneal air, portal venous gas or pneumatosis is identified. There is a large volume of stool in the colon. The stomach and small bowel are unremarkable. The patient is status post cholecystectomy. The liver, spleen, adrenal glands and biliary tree are unremarkable. The kidneys appear atrophic bilaterally. Low attenuating lesions in the left kidney are likely cysts. Linear high attenuation focus in the right kidney could be a nonobstructing stone or vascular calcification. There is marked degenerative disease about the hips and multilevel lumbar spondylosis. No lytic or sclerotic lesion is identified. IMPRESSION: Large right hydropneumothorax. No CT evidence of bowel ischemia is identified. Anasarca. Extensive atherosclerosis. Critical Value/emergent results were called by telephone at the time of interpretation on 08/26/2015  at 11:14 pm to Dr. Brantley Stage , who verbally acknowledged these results. Electronically Signed   By: Inge Rise M.D.   On: 08/26/2015 23:14   Dg Abd Acute W/chest  08/26/2015  CLINICAL DATA:  Hypotensive, blood and feces around feeding tube for several days, contracted, hypertension, coronary artery disease, dementia EXAM: DG ABDOMEN ACUTE W/ 1V CHEST COMPARISON:  None. FINDINGS: Enlargement of cardiac silhouette. Elongation of thoracic aorta. LEFT lung clear. Opacification of inferior RIGHT hemi thorax by large probable RIGHT pleural effusion. Significant atelectasis of RIGHT lung. Areas of lucency in the RIGHT hemi thorax may represent loculated pneumothorax or large bullae. Gastrostomy tube mid abdomen. Gas collection under LEFT hemidiaphragm likely represents combination of gas within the stomach and gaseous distention of the distal transverse colon and splenic flexure. No free intraperitoneal air. Paucity of small bowel gas. Diffuse osseous demineralization with degenerative changes of the lumbar spine. Degenerative changes of both hip joints. Scattered vascular calcifications. Questionable nonobstructing RIGHT renal calculus 6 mm diameter. Surgical clips RIGHT upper quadrant question cholecystectomy. IMPRESSION: No definite  evidence of bowel obstruction or perforation. Nonspecific gaseous distention of the distal transverse colon and splenic flexure. Large RIGHT pleural effusion with loculated gas collections in the RIGHT hemi thorax which may represent loculated pneumothoraces or less likely bullae; no evidence of mediastinal shift to suggest tension. RIGHT chest could be better evaluated by CT. Question nonobstructing RIGHT renal calculus. Question prior cholecystectomy. Critical Value/emergent results were called by telephone at the time of interpretation on 08/26/2015 at 7:04 pm to Dr. Brantley Stage , who verbally acknowledged these results. Electronically Signed   By: Lavonia Dana M.D.   On: 08/26/2015  19:05    Medications:  Prior to Admission:  Prescriptions prior to admission  Medication Sig Dispense Refill Last Dose  . collagenase (SANTYL) ointment Apply 1 application topically daily.   08/26/2015 at Unknown time  . LORazepam (ATIVAN) 1 MG tablet Take 1 mg by mouth 2 (two) times daily. For agitation   08/26/2015 at Unknown time  . morphine 10 MG/5ML solution Take 5 mg by mouth every 4 (four) hours as needed for moderate pain or severe pain.   08/26/2015 at 1100a  . Nutritional Supplements (FEEDING SUPPLEMENT, GLUCERNA 1.2 CAL,) LIQD Place 50 mLs into feeding tube continuous. 56m/hr every shift for supplement   08/26/2015 at Unknown time  . OXYGEN Inhale 2 L into the lungs continuous.   08/26/2015 at Unknown time  . scopolamine (TRANSDERM-SCOP, 1.5 MG,) 1 MG/3DAYS Place 1 patch onto the skin See admin instructions. Apply every 72 hours as needed for for secretions   Past Week at Unknown time   Scheduled: . antiseptic oral rinse  7 mL Mouth Rinse q12n4p  . chlorhexidine  15 mL Mouth Rinse BID  . Chlorhexidine Gluconate Cloth  6 each Topical Q0600  . mupirocin ointment  1 application Nasal BID  . [START ON 08/30/2015] pantoprazole (PROTONIX) IV  40 mg Intravenous Q12H  . piperacillin-tazobactam (ZOSYN)  IV  2.25 g Intravenous 3 times per day  . vancomycin  750 mg Intravenous Q48H   Continuous: . sodium chloride 75 mL/hr at 08/28/15 0031  . pantoprozole (PROTONIX) infusion 8 mg/hr (08/28/15 0700)   PBZJ:IRCVELFYBOF**OR** ondansetron (ZOFRAN) IV  Assesment:he was admitted with upper GI bleeding and potentially sepsis. His lactate was up but I think it may have been more related to hypovolemic shock. He has a large right pleural effusion and a right pneumothorax but is hemodynamically stable and does not appear to be in any respiratory distress. He has multiple other medical problems  Active Problems:   GI bleed   Sepsis ([place]   Dementia   Anemia   UGIB (upper gastrointestinal bleed)    Pneumothorax, right   Hyperkalemia   Acute renal failure (HCC)   Acute blood loss anemia    Plan:The current medical regimen is effective;  continue present plan and medications. Current treatments    LOS: 2 days   Maebell Lyvers L 08/28/2015, 8:20 AM

## 2015-08-28 NOTE — Progress Notes (Signed)
[facility] PROGRESS NOTE  Dylan Walsh ZOX:[id number] DOB: [birth year] DOA: 08/26/2015 PCP: No primary care provider on file.  Assessment/Plan: 1. Upper GI bleed with guaiac positive stool coming out of PEG tube. GI input appreciated. Will continue Protonix BID. No reported blood coming from PEG tube since admission. Will consider further diagnostic eval if hgb continues to trend down. 2. ABLA, Hgb 4.5 on admission. Baseline Hgb is unclear. Will try and request records from [facility] Hgb 8.7 today following transfusion of 2U PRBCs. Will continue to monitor and transfuse as needed. No evidence of ongoing bleeding at this time.  3. Possible UTI. Chronic suprapubic catheter.  UC pending. Will continue empiric abx and IVF. Lactic acid trending down, WBC 15.3.  4. Large right hydropneumothorax, revealed on abdominal CT. Patient does not appear to be in any respiratory distress at this time. Due to his advanced age and multiple medical problems, he is not a good candidate for surgical intervention. May need to continue observation. Pulmonology following 5. Hyperkalemia, improved with kayexalate.  6. Acute renal failure, likely superimposed on some element of CKD. He has evidence of anasarca on CT imaging and also has atrophic kidneys. Nephrology input appreciated. Will request records to obtain baseline renal function.  7. Dementia, appears to be at baseline.  8. Hypotension, patient initially hypotensive on admission which has resolved. Initially there was a concern for sepsis and he was started on broad spectrum abx and IV hydration. Blood cultures have been sent and will be followed up. Hypotension may be related to severe anemia and dehydration. Lactic acid trending down with IVF. Continue IV abx for now with low threshold to discontinue if no clear source of infection emerges.   Code Status: Full DVT prophylaxis: SCDs Family Communication: Patient is ward of state. Will need to consult  social services to discuss code status. Due to his age and multiple medical problems, patient should be a DNR.  Disposition Plan: Continue to monitor in ICU.    Consultants:  GI  Pulmonology  Nephrology   Procedures:  Transfused 2U PRBCs 1/24  Antibiotics:  Zosyn 1/24>>  Vancomycin 1/24>>  HPI/Subjective: Patient is sleeping.  Objective: Filed Vitals:   08/28/15 0500 08/28/15 0600  BP: 150/82 138/87  Pulse: 95 98  Temp:    Resp: 21 20    Intake/Output Summary (Last 24 hours) at 08/28/15 0724 Last data filed at 08/28/15 0500  Gross per 24 hour  Intake 591.25 ml  Output   1000 ml  Net -408.75 ml   Filed Weights   08/26/15 2223 08/27/15 0500 08/28/15 0500  Weight: 52 kg (114 lb 10.2 oz) 52 kg (114 lb 10.2 oz) 53.7 kg (118 lb 6.2 oz)    Exam:  General: NAD, looks comfortable Cardiovascular: RRR, S1, S2  Respiratory: Diminished on the right side.  Abdomen: unchanged from yesterday Musculoskeletal: 1+ edema bilaterally   Data Reviewed: Basic Metabolic Panel:  Recent Labs Lab 08/26/15 1950 08/27/15 0444 08/28/15 0428  NA 135 136 143  K 6.3* 6.2* 4.4  CL 96* 100* 106  CO2 22 20* 21*  GLUCOSE 183* 157* 125*  BUN 103* 102* 99*  CREATININE 3.03* 2.93* 2.90*  CALCIUM 8.1* 8.0* 8.0*   Liver Function Tests:  Recent Labs Lab 08/26/15 1950 08/27/15 0444  AST 43* 199*  ALT 23 104*  ALKPHOS 60 65  BILITOT 0.7 2.1*  PROT 5.5* 5.5*  ALBUMIN 2.6* 2.7*    Recent Labs Lab 08/26/15 1950  LIPASE 17  CBC:  Recent Labs Lab 08/26/15 1950 08/27/15 0444 08/28/15 0428  WBC 12.9* 14.6* 15.3*  NEUTROABS 10.7*  --   --   HGB 4.5* 9.0* 8.7*  HCT 14.0* 26.4* 25.5*  MCV 83.8 85.4 85.6  PLT 260 196 212    BNP (last 3 results)  Recent Labs  08/26/15 1950  BNP 2548.0*     Recent Results (from the past 240 hour(s))  Blood culture (routine x 2)     Status: None (Preliminary result)   Collection Time: 08/26/15  8:20 PM  Result Value Ref Range  Status   Specimen Description BLOOD LEFT HAND  Final   Special Requests BOTTLES DRAWN AEROBIC ONLY 6CC  Final   Culture NO GROWTH < 24 HOURS  Final   Report Status PENDING  Incomplete  Blood culture (routine x 2)     Status: None (Preliminary result)   Collection Time: 08/26/15  8:35 PM  Result Value Ref Range Status   Specimen Description BLOOD LEFT HAND  Final   Special Requests BOTTLES DRAWN AEROBIC ONLY 6CC  Final   Culture NO GROWTH < 24 HOURS  Final   Report Status PENDING  Incomplete  MRSA PCR Screening     Status: Abnormal   Collection Time: 08/26/15 11:30 PM  Result Value Ref Range Status   MRSA by PCR POSITIVE (A) NEGATIVE Final    Comment:        The GeneXpert MRSA Assay (FDA approved for NASAL specimens only), is one component of a comprehensive MRSA colonization surveillance program. It is not intended to diagnose MRSA infection nor to guide or monitor treatment for MRSA infections. RESULT CALLED TO, READ BACK BY AND VERIFIED WITH: HAMMOCK S AT 0315 ON [id number] BY FORSYTH K      Studies: Ct Abdomen Pelvis Wo Contrast  08/26/2015  CLINICAL DATA:  Severe lactic acidosis. Hypotension. Elevated creatinine. Initial encounter. EXAM: CT ABDOMEN AND PELVIS WITHOUT CONTRAST TECHNIQUE: Multidetector CT imaging of the abdomen and pelvis was performed following the standard protocol without IV contrast. COMPARISON:  Chest in two views abdomen earlier today. FINDINGS: The study is somewhat limited due to difficulty positioning the patient. The patient has a large right pleural effusion and right pneumothorax. No left effusion is identified. Heart size is enlarged. There is diffuse body wall and mesenteric edema. A gastrostomy tube is in place. No free intraperitoneal air, portal venous gas or pneumatosis is identified. There is a large volume of stool in the colon. The stomach and small bowel are unremarkable. The patient is status post cholecystectomy. The liver, spleen, adrenal  glands and biliary tree are unremarkable. The kidneys appear atrophic bilaterally. Low attenuating lesions in the left kidney are likely cysts. Linear high attenuation focus in the right kidney could be a nonobstructing stone or vascular calcification. There is marked degenerative disease about the hips and multilevel lumbar spondylosis. No lytic or sclerotic lesion is identified. IMPRESSION: Large right hydropneumothorax. No CT evidence of bowel ischemia is identified. Anasarca. Extensive atherosclerosis. Critical Value/emergent results were called by telephone at the time of interpretation on 08/26/2015 at 11:14 pm to Dr. Crista Curb , who verbally acknowledged these results. Electronically Signed   By: Drusilla Kanner M.D.   On: 08/26/2015 23:14   Dg Abd Acute W/chest  08/26/2015  CLINICAL DATA:  Hypotensive, blood and feces around feeding tube for several days, contracted, hypertension, coronary artery disease, dementia EXAM: DG ABDOMEN ACUTE W/ 1V CHEST COMPARISON:  None. FINDINGS: Enlargement of cardiac silhouette.  Elongation of thoracic aorta. LEFT lung clear. Opacification of inferior RIGHT hemi thorax by large probable RIGHT pleural effusion. Significant atelectasis of RIGHT lung. Areas of lucency in the RIGHT hemi thorax may represent loculated pneumothorax or large bullae. Gastrostomy tube mid abdomen. Gas collection under LEFT hemidiaphragm likely represents combination of gas within the stomach and gaseous distention of the distal transverse colon and splenic flexure. No free intraperitoneal air. Paucity of small bowel gas. Diffuse osseous demineralization with degenerative changes of the lumbar spine. Degenerative changes of both hip joints. Scattered vascular calcifications. Questionable nonobstructing RIGHT renal calculus 6 mm diameter. Surgical clips RIGHT upper quadrant question cholecystectomy. IMPRESSION: No definite evidence of bowel obstruction or perforation. Nonspecific gaseous distention of  the distal transverse colon and splenic flexure. Large RIGHT pleural effusion with loculated gas collections in the RIGHT hemi thorax which may represent loculated pneumothoraces or less likely bullae; no evidence of mediastinal shift to suggest tension. RIGHT chest could be better evaluated by CT. Question nonobstructing RIGHT renal calculus. Question prior cholecystectomy. Critical Value/emergent results were called by telephone at the time of interpretation on 08/26/2015 at 7:04 pm to Dr. Crista Curb , who verbally acknowledged these results. Electronically Signed   By: Ulyses Southward M.D.   On: 08/26/2015 19:05    Scheduled Meds: . antiseptic oral rinse  7 mL Mouth Rinse q12n4p  . chlorhexidine  15 mL Mouth Rinse BID  . Chlorhexidine Gluconate Cloth  6 each Topical Q0600  . mupirocin ointment  1 application Nasal BID  . [START ON 08/30/2015] pantoprazole (PROTONIX) IV  40 mg Intravenous Q12H  . piperacillin-tazobactam (ZOSYN)  IV  2.25 g Intravenous 3 times per day  . vancomycin  750 mg Intravenous Q48H   Continuous Infusions: . sodium chloride 75 mL/hr at 08/28/15 0031  . pantoprozole (PROTONIX) infusion 8 mg/hr (08/28/15 0020)    Active Problems:   GI bleed   Sepsis (HCC)   Dementia   Anemia   UGIB (upper gastrointestinal bleed)   Pneumothorax, right   Hyperkalemia   Acute renal failure (HCC)   Acute blood loss anemia   Time spent: 30 minutes  Jehanzeb Memon. MD [facility] Pager [phone]. If 7PM-7AM, please contact night-coverage at [email or web address], password [facility] 08/28/2015, 7:24 AM  LOS: 2 days     By signing my name below, I, Burnett Harry, attest that this documentation has been prepared under the direction and in the presence of [facility]. MD Electronically Signed: Burnett Harry, Scribe. 08/28/2015 10:19 am  I, Dr. Erick Blinks, personally performed the services described in this documentaiton. All medical record entries made by the scribe were at my  direction and in my presence. I have reviewed the chart and agree that the record reflects my personal performance and is accurate and complete  Erick Blinks, MD, 08/28/2015 10:28 AM

## 2015-08-28 NOTE — Clinical Social Work Note (Signed)
Clinical Social Work Assessment  Patient Details  Name: Dylan Walsh MRN: [id number] Date of Birth: January 29, 1925  Date of referral:  08/28/15               Reason for consult:  Facility Placement                Permission sought to share information with:    Permission granted to share information::     Name::        Agency::     Relationship::     Contact Information:     Housing/Transportation Living arrangements for the past 2 months:  Skilled Building surveyor of Information:   Vara Guardian, Guardian of Estate; Production manager at [employer]. DSS. ) Patient Interpreter Needed:  None Criminal Activity/Legal Involvement Pertinent to Current Situation/Hospitalization:  No - Comment as needed Significant Relationships:  Other(Comment) (Legal Guardian, Rexene Alberts and Guardian of [place], Vara Guardian.) Lives with:  Facility Resident Do you feel safe going back to the place where you live?  Yes Need for family participation in patient care:  Yes (Comment)  Care giving concerns: None identified. Facility resident.    Social Worker assessment / plan: CSW spoke with Vara Guardian [phone]) who identified that she was the guardian of patient's estate. She indicated that patient has been a resident at [facility] for 1.5 years. She stated that he is bed bound. Ms. Laural Benes indicated that [facility] DSS is the legal guardian for patient. She identified that Donnella Sham [phone]) was patient's guardian.  She stated that Jinny Sanders [phone]) was Mr. Loper's supervisor. CSW spoke with Ms. Waddell and advised that patient was hospitalize at [facility] in ICU. CSW discussed that attending desired to make patient a DNR. Ms. Mordecai Maes requested that CSW fax medical documentation stating such why patient needed to be DNR.  CSW left a voicemail message for Mr. Loper.    Employment status:  Retired Health and safety inspector:  Medicaid [place] PT Recommendations:   Not assessed at this time Information / Referral to community resources:     Patient/Family's Response to care:  Patient will return to [facility].   Patient/Family's Understanding of and Emotional Response to Diagnosis, Current Treatment, and Prognosis:  CSW is advising patient's guardian of the need to contact patient's attending or nurse for medical update.   Emotional Assessment Appearance:  Appears stated age Attitude/Demeanor/Rapport:  Unable to Assess Affect (typically observed):  Unable to Assess Orientation:    Alcohol / Substance use:  Not Applicable Psych involvement (Current and /or in the community):  No (Comment)  Discharge Needs  Concerns to be addressed:  Discharge Planning Concerns Readmission within the last 30 days:  Yes Current discharge risk:  None Barriers to Discharge:  No Barriers Identified   Annice Needy, LCSW 08/28/2015, 5:04 PM [phone]

## 2015-08-28 NOTE — Progress Notes (Addendum)
Subjective: None. Patient with severe dementia and doesn't comunicate   Objective: Vital signs in last 24 hours: Temp:  [96.7 F (35.9 C)-97.1 F (36.2 C)] 97 F (36.1 C) (01/26 0400) Pulse Rate:  [86-102] 94 (01/26 0700) Resp:  [15-31] 20 (01/26 0700) BP: (109-158)/(60-92) 152/81 mmHg (01/26 0700) SpO2:  [90 %-100 %] 93 % (01/26 0700) Weight:  [118 lb 6.2 oz (53.7 kg)] 118 lb 6.2 oz (53.7 kg) (01/26 0500)  Intake/Output from previous day: 01/25 0701 - 01/26 0700 In: 915 [I.V.:865; IV Piggyback:50] Out: 1000 [Urine:1000] Intake/Output this shift: Total I/O In: -  Out: 300 [Urine:300]   Recent Labs  08/26/15 1950 08/27/15 0444 08/28/15 0428  HGB 4.5* 9.0* 8.7*    Recent Labs  08/27/15 0444 08/28/15 0428  WBC 14.6* 15.3*  RBC 3.09* 2.98*  HCT 26.4* 25.5*  PLT 196 212    Recent Labs  08/27/15 0444 08/28/15 0428  NA 136 143  K 6.2* 4.4  CL 100* 106  CO2 20* 21*  BUN 102* 99*  CREATININE 2.93* 2.90*  GLUCOSE 157* 125*  CALCIUM 8.0* 8.0*    Recent Labs  08/28/15 0428  INR 1.28   Generally patient is sleepy but arousable. Becomes restless when he wakes up. Chest: Decreased breath some bilaterally, no wheezing Heart exam regular rate and rhythm Extremities: No edema patient with multiple pressure ulcers on his toes  Assessment/Plan: Problem #1 acute kidney injury: Possibly secondary to sepsis/prerenal syndrome presently presents nonoliguric and is BUN and creatinine is improving. CAT scan of the kidneys showed bilateral atrophy but no previous blood work to see whether patient has chronic renal failure. Problem #2 hyperkalemia: His potassium has corrected Problem #3 hypertension: His blood pressure is better today. Problem #4 anemia: Secondary to GI bleeding. His hemoglobin is 8.7 stable. Patient has received blood transfusion. Problem #5 dementia Problem #6 history of sepsis: Possibly UTI. Presently he is on antibiotics. He shouldn't is a febrile but  his white blood cell count is high. Problem #7 right large hydropneumothorax Plan: 1] Continue his present management ] We'll check his present metabolic panel in the morning.   Othon Guardia S 08/28/2015, 8:31 AM

## 2015-08-28 NOTE — Progress Notes (Addendum)
Initial Nutrition Assessment  DOCUMENTATION CODES:  Underweight, Non-severe (moderate) malnutrition in context of chronic illness  INTERVENTION:  Initiate Vital af 1.2 @ 15 ml/hr via PEG and increase by 10 ml every 4 hours to goal rate of 60 ml/hr.   Tube feeding regimen provides 1728 kcal (99% of needs), 108 grams of protein, and 1168 ml of H2O.   If not receiving IVF, add 150 ml flush q 4 hrs to provide additional 900 ml fluid  NUTRITION DIAGNOSIS:  Increased nutrient needs related to wound healing, acute illness as evidenced by estimated nutrition requirements for the conditions  GOAL:  Patient will meet greater than or equal to 90% of their needs  MONITOR:  Diet advancement, Labs, Weight trends, TF tolerance, I & O's, Skin  REASON FOR ASSESSMENT:  Consult Enteral/tube feeding initiation and management  ASSESSMENT:  82 y/o male PMHx HTN, CAD, Dementia, GERD, PEG, dysphagia who presents from snf for hypotension and g-tube output w/ feculent material and blood-tinged discharge. Pt found to be septic, anemic, stool guaiac positive w/ pneumothorax/pleural effusion and in ARF on CKD. Pt is ward of state, but with poor prognosis    Pt unable to provide information. From PTA medication list, appears pt was receiving Glucerna 1.2 at 55 cc/hr continuous feeding.   This TF provides: 1584 kcals, 79 g Pro, 1065 mls fluid  Confirmed with MD, ok to feed as patient has no recent evidence GIB.  NFPE: Mild-moderate fat/muscle wasting. Fairly limited due to patient being in fetal position  Labs reviewed: Anemia, elveated WBC, Elevated blood sugars, decreased kidney function  Diet Order:  Diet NPO time specified  Skin:  Abrasion sacrum, toe blister/open ulcers, PU stage 2 sacrum, PU stage 2 R elbow, PU stage 2 L elbow  Last BM:  1/26-incontinent  Height:  Ht Readings from Last 1 Encounters:  08/26/15  (1.676 m)   Weight:  Wt Readings from Last 1 Encounters:  08/28/15 118 lb 6.2  oz (53.7 kg)   Admit weight: 114 lbs  Ideal Body Weight:  64.55 kg  BMI:  Body mass index using admit weight is 18.4 kg/(m^2).  Estimated Nutritional Needs:  Kcal:  1750-1950 kcals (34-38 kcal/kg bw) Protein:  88-104 g (1.7-2 g/kg bw) Fluid:  2 liters  EDUCATION NEEDS:  No education needs identified at this time  Christophe Louis RD, LDN Nutrition Pager: [phone] 08/28/2015 1:09 PM

## 2015-08-28 NOTE — Progress Notes (Signed)
Report taken from Dylan Walsh, [facility], RN.  Pt. Arrived to unit rm. 325

## 2015-08-28 NOTE — Progress Notes (Signed)
Patient ID: Dylan Walsh, male   DOB: 1924/10/26, 82 y.o.   MRN: [id number] Lying on side. He has had a slight drop in his hemoglobin.  Blood pressure 140/79, pulse 89, temperature 97.1 F (36.2 C), temperature source Axillary, resp. rate 20, height  (1.676 m), weight 118 lb 6.2 oz (53.7 kg), SpO2 96 %. Foley draining clear urine.  Per Dylan Walsh patient had a marooned color stool yesterday. She will send G tube site culture to lab.  CBC    Component Value Date/Time   WBC 15.3* 08/28/2015 0428   RBC 2.98* 08/28/2015 0428   HGB 8.7* 08/28/2015 0428   HCT 25.5* 08/28/2015 0428   PLT 212 08/28/2015 0428   MCV 85.6 08/28/2015 0428   MCH 29.2 08/28/2015 0428   MCHC 34.1 08/28/2015 0428   RDW 17.0* 08/28/2015 0428   LYMPHSABS 0.8 08/26/2015 1950   MONOABS 1.4* 08/26/2015 1950   EOSABS 0.0 08/26/2015 1950   BASOSABS 0.0 08/26/2015 1950    CBC Latest Ref Rng 08/28/2015 08/27/2015 08/26/2015  WBC 4.0 - 10.5 K/uL 15.3(H) 14.6(H) 12.9(H)  Hemoglobin 13.0 - 17.0 g/dL 7.8(G) 9.5(A) 4.5(LL)  Hematocrit 39.0 - 52.0 % 25.5(L) 26.4(L) 14.0(L)  Platelets 150 - 400 K/uL 212 196 260   Assessment: Possible upper GI Dylan Walsh drop in his hemoglobin.   Plan: Will start Jevity at 25cc hr.

## 2015-08-28 NOTE — Progress Notes (Signed)
Patient has not passed any blood per rectum today. Hemoglobin from this morning 8.7. Patient begun on gastric feeding. He is on vital 1.2 as recommended by Christophe Louis RD. Goal rate is 60 ML per hour which will give him 1720 Kcal.

## 2015-08-28 NOTE — Progress Notes (Signed)
Patient transported to 300 via bed.

## 2015-08-29 ENCOUNTER — Encounter (HOSPITAL_COMMUNITY): Payer: Self-pay | Admitting: *Deleted

## 2015-08-29 DIAGNOSIS — J939 Pneumothorax, unspecified: Secondary | ICD-10-CM

## 2015-08-29 DIAGNOSIS — E440 Moderate protein-calorie malnutrition: Secondary | ICD-10-CM | POA: Diagnosis present

## 2015-08-29 DIAGNOSIS — K922 Gastrointestinal hemorrhage, unspecified: Secondary | ICD-10-CM

## 2015-08-29 DIAGNOSIS — N179 Acute kidney failure, unspecified: Secondary | ICD-10-CM

## 2015-08-29 DIAGNOSIS — D62 Acute posthemorrhagic anemia: Secondary | ICD-10-CM

## 2015-08-29 DIAGNOSIS — Z931 Gastrostomy status: Secondary | ICD-10-CM

## 2015-08-29 LAB — BASIC METABOLIC PANEL
Anion gap: 16 — ABNORMAL HIGH (ref 5–15)
BUN: 94 mg/dL — ABNORMAL HIGH (ref 6–20)
CALCIUM: 8.1 mg/dL — AB (ref 8.9–10.3)
CO2: 21 mmol/L — AB (ref 22–32)
CREATININE: 3.09 mg/dL — AB (ref 0.61–1.24)
Chloride: 107 mmol/L (ref 101–111)
GFR calc non Af Amer: 17 mL/min — ABNORMAL LOW (ref >60)
GFR, EST AFRICAN AMERICAN: 19 mL/min — AB (ref >60)
Glucose, Bld: 165 mg/dL — ABNORMAL HIGH (ref 65–99)
Potassium: 3.6 mmol/L (ref 3.5–5.1)
SODIUM: 144 mmol/L (ref 135–145)

## 2015-08-29 LAB — CBC
HCT: 26.8 % — ABNORMAL LOW (ref 39.0–52.0)
Hemoglobin: 8.9 g/dL — ABNORMAL LOW (ref 13.0–17.0)
MCH: 29.3 pg (ref 26.0–34.0)
MCHC: 33.2 g/dL (ref 30.0–36.0)
MCV: 88.2 fL (ref 78.0–100.0)
PLATELETS: 218 10*3/uL (ref 150–400)
RBC: 3.04 MIL/uL — AB (ref 4.22–5.81)
RDW: 17.8 % — ABNORMAL HIGH (ref 11.5–15.5)
WBC: 15.1 10*3/uL — ABNORMAL HIGH (ref 4.0–10.5)

## 2015-08-29 LAB — GLUCOSE, CAPILLARY
GLUCOSE-CAPILLARY: 137 mg/dL — AB (ref 65–99)
Glucose-Capillary: 130 mg/dL — ABNORMAL HIGH (ref 65–99)
Glucose-Capillary: 172 mg/dL — ABNORMAL HIGH (ref 65–99)
Glucose-Capillary: 195 mg/dL — ABNORMAL HIGH (ref 65–99)

## 2015-08-29 MED ORDER — FUROSEMIDE 10 MG/ML IJ SOLN
40.0000 mg | Freq: Two times a day (BID) | INTRAMUSCULAR | Status: DC
  Administered 2015-08-29 – 2015-09-01 (×7): 40 mg via INTRAVENOUS
  Filled 2015-08-29 (×7): qty 4

## 2015-08-29 MED ORDER — INSULIN ASPART 100 UNIT/ML ~~LOC~~ SOLN
0.0000 [IU] | SUBCUTANEOUS | Status: DC
  Administered 2015-08-29 – 2015-08-30 (×3): 2 [IU] via SUBCUTANEOUS
  Administered 2015-08-30 – 2015-08-31 (×2): 1 [IU] via SUBCUTANEOUS
  Administered 2015-08-31: 2 [IU] via SUBCUTANEOUS
  Administered 2015-08-31 (×3): 1 [IU] via SUBCUTANEOUS
  Administered 2015-09-01: 2 [IU] via SUBCUTANEOUS
  Administered 2015-09-01: 1 [IU] via SUBCUTANEOUS
  Administered 2015-09-01: 3 [IU] via SUBCUTANEOUS
  Administered 2015-09-01: 2 [IU] via SUBCUTANEOUS
  Administered 2015-09-01: 1 [IU] via SUBCUTANEOUS
  Administered 2015-09-01: 2 [IU] via SUBCUTANEOUS
  Administered 2015-09-02: 3 [IU] via SUBCUTANEOUS
  Administered 2015-09-02: 2 [IU] via SUBCUTANEOUS
  Administered 2015-09-02: 1 [IU] via SUBCUTANEOUS
  Administered 2015-09-02: 2 [IU] via SUBCUTANEOUS
  Administered 2015-09-02: 5 [IU] via SUBCUTANEOUS
  Administered 2015-09-02 – 2015-09-03 (×3): 2 [IU] via SUBCUTANEOUS
  Administered 2015-09-03: 5 [IU] via SUBCUTANEOUS
  Administered 2015-09-03: 2 [IU] via SUBCUTANEOUS

## 2015-08-29 NOTE — Progress Notes (Signed)
Nutrition Follow-up  DOCUMENTATION CODES:  Underweight, Non-severe (moderate) malnutrition in context of chronic illness  INTERVENTION:  Continue Vital af 1.2 @ 60 via PEG   Tube feeding regimen provides 1728 kcal (99% of needs), 108 grams of protein, and 1168 ml of H2O.   If not receiving IVF, add 150 ml flush q 4 hrs to provide additional 900 ml fluid  NUTRITION DIAGNOSIS:  Increased nutrient needs related to wound healing, acute illness as evidenced by estimated nutrition requirements for the conditions  Ongoing  GOAL:  Patient will meet greater than or equal to 90% of their needs  MONITOR:  Diet advancement, Labs, Weight trends, TF tolerance, I & O's, Skin  ASSESSMENT:  82 y/o male PMHx HTN, CAD, Dementia, GERD, PEG, dysphagia who presents from snf for hypotension and g-tube output w/ feculent material and blood-tinged discharge. Pt found to be septic, anemic, stool guaiac positive w/ pneumothorax/pleural effusion and in ARF on CKD. Pt is ward of state, but with poor prognosis    Interval hx: No blood per rectum. Seems to be tolerating TF  On RD arrival, tf held d/t pump malfunction. Pt more vocal today. Abdomen/distention hard to assess with bandaging   Labs reviewed: relatively unchanged from yesterday  Diet Order:  Diet NPO time specified  Skin:  Abrasion sacrum, toe blister/open ulcers, PU stage 2 sacrum, PU stage 2 R elbow, PU stage 2 L elbow  Last BM:  1/26-incontinent  Height:  Ht Readings from Last 1 Encounters:  08/26/15  (1.676 m)   Weight:  Wt Readings from Last 1 Encounters:  08/29/15 117 lb 3.2 oz (53.162 kg)   Admit weight: 114 lbs  Ideal Body Weight:  64.55 kg  BMI:  Body mass index using admit weight is 18.4 kg/(m^2).  Estimated Nutritional Needs:  Kcal:  1750-1950 kcals (34-38 kcal/kg bw) Protein:  88-104 g (1.7-2 g/kg bw) Fluid:  2 liters  EDUCATION NEEDS:  No education needs identified at this time  Christophe Louis RD,  LDN Nutrition Pager: [phone] 08/29/2015 12:58 PM

## 2015-08-29 NOTE — Progress Notes (Signed)
Subjective: Patient is more alert and moaning but no compalint   Objective: Vital signs in last 24 hours: Temp:  [97.4 F (36.3 C)-97.7 F (36.5 C)] 97.7 F (36.5 C) (01/27 0550) Pulse Rate:  [54-101] 101 (01/27 0550) Resp:  [18-22] 20 (01/27 0550) BP: (127-164)/(52-87) 127/52 mmHg (01/27 0550) SpO2:  [87 %-98 %] 98 % (01/27 0550) Weight:  [117 lb 3.2 oz (53.162 kg)] 117 lb 3.2 oz (53.162 kg) (01/27 0550)  Intake/Output from previous day: 01/26 0701 - 01/27 0700 In: 1620.9 [I.V.:1620.9] Out: 960 [Urine:950; Drains:10] Intake/Output this shift:     Recent Labs  08/26/15 1950 08/27/15 0444 08/28/15 0428 08/29/15 0702  HGB 4.5* 9.0* 8.7* 8.9*    Recent Labs  08/28/15 0428 08/29/15 0702  WBC 15.3* 15.1*  RBC 2.98* 3.04*  HCT 25.5* 26.8*  PLT 212 218    Recent Labs  08/28/15 0428 08/29/15 0702  NA 143 144  K 4.4 3.6  CL 106 107  CO2 21* 21*  BUN 99* 94*  CREATININE 2.90* 3.09*  GLUCOSE 125* 165*  CALCIUM 8.0* 8.1*    Recent Labs  08/28/15 0428  INR 1.28   Generally patient seems to be more alert Chest: Decreased breath some bilaterally, no wheezing Heart exam regular rate and rhythm Extremities: No edema patient with multiple pressure ulcers on his toes  Assessment/Plan: Problem #1 acute kidney injury: Possibly secondary to sepsis/prerenal syndrome presently presents nonoliguric and is BUN and creatinine started worsening.  Patient on Vancomycin. . None oligutic Problem #2 hyperkalemia: His potassium has corrected Problem #3 hypertension: His blood pressure is better today. Problem #4 anemia: Secondary to GI bleeding. His hemoglobin is low but stable Problem #5 dementia Problem #6 history of sepsis: Possibly UTI/pressure soar. Presently he is on antibiotics. He shouldn't is a febrile but his white blood cell count remains high. Problem #7 right large hydropneumothorax Plan: 1] increase ivf to 100 cc/hr 2] Start on Lasix 40 mg iv bid 3] We'll  check  metabolic panel in the morning. 4] If his renal function continue to deteriorate we may need to change his Vancomycin   Dylan Walsh 08/29/2015, 8:46 AM

## 2015-08-29 NOTE — Progress Notes (Signed)
[facility] PROGRESS NOTE  Dylan Walsh ZOX:[id number] DOB: 1925/05/24 DOA: 08/26/2015 PCP: No primary care provider on file.  Assessment/Plan: 1. Upper GI bleed with guaiac positive stool coming out of PEG tube. GI input appreciated. Will continue Protonix BID and tube feedings. No reported blood coming from PEG tube since admission. Will consider further diagnostic eval if hgb continues to trend down. 2. ABLA, Hgb 4.5 on admission. Baseline Hgb is unclear. Will try and request records from [facility]. Hgb stable following transfusion of 2U PRBCs. Will continue to monitor and transfuse if needed. No evidence of ongoing bleeding at this time.  3. Possible UTI. Patient has a chronic suprapubic catheter.  UC in process.  On IV antibiotics. Lactic acid trending down, WBC 15.1 today.  4. Large right hydropneumothorax, revealed on abdominal CT. Patient does not appear to be in any respiratory distress at this time. Due to his advanced age and multiple medical problems, he is not a good candidate for surgical intervention. Will continue observation. Pulmonology following 5. Hyperkalemia, resolved with kayexalate.  6. Acute renal failure, likely superimposed on some element of CKD. He has evidence of anasarca on CT imaging and also has atrophic kidneys. Nephrology input appreciated. WE have requested records to obtain baseline renal function.  7. Dementia, appears to be at baseline.  8. Hypotension, patient initially hypotensive on admission which has resolved. Initially there was a concern for sepsis and he was started on broad spectrum abx and IV hydration. Blood cultures have been sent and have shown no growth to date. Hypotension may have been related to severe anemia and dehydration. Lactic acid trending down with IVF. Continue IV abx for now with low threshold to discontinue if no clear source of infection emerges.   Code Status: Full DVT prophylaxis: SCDs Family Communication: Patient is  ward of state. Left voicemail with legal gaurdian to discuss code status. Due to his age and multiple medical problems, patient should be a DNR.  Disposition Plan: discharge back to SNF when improved   Consultants:  GI  Pulmonology  Nephrology   Procedures:  Transfused 2U PRBCs 1/24  Antibiotics:  Zosyn 1/24>>  Vancomycin 1/24>>  HPI/Subjective: Patient is unable to provide reliable history  Objective: Filed Vitals:   08/28/15 2158 08/29/15 0550  BP: 164/78 127/52  Pulse: 98 101  Temp: 97.5 F (36.4 C) 97.7 F (36.5 C)  Resp: 20 20    Intake/Output Summary (Last 24 hours) at 08/29/15 0824 Last data filed at 08/29/15 8119  Gross per 24 hour  Intake    530 ml  Output    660 ml  Net   -130 ml   Filed Weights   08/27/15 0500 08/28/15 0500 08/29/15 0550  Weight: 52 kg (114 lb 10.2 oz) 53.7 kg (118 lb 6.2 oz) 53.162 kg (117 lb 3.2 oz)    Exam:  General: NAD, looks comfortable Cardiovascular: RRR, S1, S2  Respiratory: clear bilaterally, No wheezing, rales or rhonchi Abdomen: soft, non tender, no distention , bowel sounds normal, PEG tube and suprapubic catheter in place Musculoskeletal: 1+ edema b/l    Data Reviewed: Basic Metabolic Panel:  Recent Labs Lab 08/26/15 1950 08/27/15 0444 08/28/15 0428 08/29/15 0702  NA 135 136 143 144  K 6.3* 6.2* 4.4 3.6  CL 96* 100* 106 107  CO2 22 20* 21* 21*  GLUCOSE 183* 157* 125* 165*  BUN 103* 102* 99* 94*  CREATININE 3.03* 2.93* 2.90* 3.09*  CALCIUM 8.1* 8.0* 8.0* 8.1*   Liver  Function Tests:  Recent Labs Lab 08/26/15 1950 08/27/15 0444  AST 43* 199*  ALT 23 104*  ALKPHOS 60 65  BILITOT 0.7 2.1*  PROT 5.5* 5.5*  ALBUMIN 2.6* 2.7*    Recent Labs Lab 08/26/15 1950  LIPASE 17    CBC:  Recent Labs Lab 08/26/15 1950 08/27/15 0444 08/28/15 0428 08/29/15 0702  WBC 12.9* 14.6* 15.3* 15.1*  NEUTROABS 10.7*  --   --   --   HGB 4.5* 9.0* 8.7* 8.9*  HCT 14.0* 26.4* 25.5* 26.8*  MCV 83.8 85.4  85.6 88.2  PLT 260 196 212 218    BNP (last 3 results)  Recent Labs  08/26/15 1950  BNP 2548.0*     Recent Results (from the past 240 hour(s))  Urine culture     Status: None   Collection Time: 08/26/15  8:00 PM  Result Value Ref Range Status   Specimen Description URINE, CLEAN CATCH  Final   Special Requests NONE  Final   Culture   Final    >=100,000 COLONIES/mL YEAST Performed at [facility]    Report Status 08/28/2015 FINAL  Final  Blood culture (routine x 2)     Status: None (Preliminary result)   Collection Time: 08/26/15  8:20 PM  Result Value Ref Range Status   Specimen Description BLOOD LEFT HAND  Final   Special Requests BOTTLES DRAWN AEROBIC ONLY 6CC  Final   Culture NO GROWTH 3 DAYS  Final   Report Status PENDING  Incomplete  Blood culture (routine x 2)     Status: None (Preliminary result)   Collection Time: 08/26/15  8:35 PM  Result Value Ref Range Status   Specimen Description BLOOD LEFT HAND  Final   Special Requests BOTTLES DRAWN AEROBIC ONLY 6CC  Final   Culture NO GROWTH 3 DAYS  Final   Report Status PENDING  Incomplete  MRSA PCR Screening     Status: Abnormal   Collection Time: 08/26/15 11:30 PM  Result Value Ref Range Status   MRSA by PCR POSITIVE (A) NEGATIVE Final    Comment:        The GeneXpert MRSA Assay (FDA approved for NASAL specimens only), is one component of a comprehensive MRSA colonization surveillance program. It is not intended to diagnose MRSA infection nor to guide or monitor treatment for MRSA infections. RESULT CALLED TO, READ BACK BY AND VERIFIED WITH: HAMMOCK S AT 0315 ON [id number] BY FORSYTH K      Studies: No results found.  Scheduled Meds: . antiseptic oral rinse  7 mL Mouth Rinse q12n4p  . chlorhexidine  15 mL Mouth Rinse BID  . Chlorhexidine Gluconate Cloth  6 each Topical Q0600  . mupirocin ointment  1 application Nasal BID  . [START ON 08/30/2015] pantoprazole (PROTONIX) IV  40 mg Intravenous Q12H  .  piperacillin-tazobactam (ZOSYN)  IV  2.25 g Intravenous 3 times per day  . polyethylene glycol  17 g Oral BID  . vancomycin  750 mg Intravenous Q48H   Continuous Infusions: . sodium chloride 75 mL/hr at 08/28/15 0748  . feeding supplement (VITAL AF 1.2 CAL) 1,000 mL (08/28/15 1416)  . pantoprozole (PROTONIX) infusion 8 mg/hr (08/28/15 2100)    Active Problems:   GI bleed   Sepsis (HCC)   Dementia   Anemia   UGIB (upper gastrointestinal bleed)   Pneumothorax, right   Hyperkalemia   Acute renal failure (HCC)   Acute blood loss anemia   Pressure ulcer  Time spent: 25 minutes  [employer]. MD [facility] Pager [phone]. If 7PM-7AM, please contact night-coverage at [email or web address], password [facility] 08/29/2015, 8:24 AM  LOS: 3 days

## 2015-08-29 NOTE — Progress Notes (Signed)
No significant change. Events noted. He is still poorly responsive. He has absent breath sounds on the right. I will be happy to help with signing paperwork to get his CODE STATUS changed to DO NOT RESUSCITATE when it's available.  As far as pulmonary status he is stable and I will plan to follow somewhat more peripherally

## 2015-08-29 NOTE — Care Management Important Message (Signed)
Important Message  Patient Details  Name: Dylan Walsh MRN: [id number] Date of Birth: [birth year]   Medicare Important Message Given:  Yes    Adonis Huguenin, RN 08/29/2015, 11:03 AM

## 2015-08-29 NOTE — Care Management Note (Signed)
Case Management Note  Patient Details  Name: Dylan Walsh MRN: [id number] Date of Birth: August 18, 1924  Subjective/Objective:                    Action/Plan:  Discharge back to SNF when stable. CSW following.   Expected Discharge Date:                  Expected Discharge Plan:  Skilled Nursing Facility  In-House Referral:  Clinical Social Work  Discharge planning Services  CM Consult  Post Acute Care Choice:  NA Choice offered to:  NA  DME Arranged:    DME Agency:     HH Arranged:    HH Agency:     Status of Service:  Completed, signed off  Medicare Important Message Given:  Yes Date Medicare IM Given:    Medicare IM give by:    Date Additional Medicare IM Given:    Additional Medicare Important Message give by:     If discussed at Long Length of Stay Meetings, dates discussed:    Additional Comments:  Adonis Huguenin, RN 08/29/2015, 1:50 PM

## 2015-08-29 NOTE — Progress Notes (Signed)
Positive blood cultures: Gram Positive Cocci in Clusters.  On call MD notified.

## 2015-08-29 NOTE — Progress Notes (Signed)
  Subjective:  No history is available from the patient. According to nursing staff he is passing flatness but no melena noted.  Objective: Blood pressure 154/75, pulse 87, temperature 97.8 F (36.6 C), temperature source Oral, resp. rate 20, height  (1.676 m), weight 117 lb 3.2 oz (53.162 kg), SpO2 91 %. Patient is awake and has eye contact. He is mumbling words. Abdominal exam reveals gastrostomy tube and suprapubic Foley's catheter is in place. Bowel sounds are normal. There is bilateral around G-tube site but no fecal matter of blood noted. Gastric residual is 5 ML.  Labs/studies Results:   Recent Labs  08/27/15 0444 08/28/15 0428 08/29/15 0702  WBC 14.6* 15.3* 15.1*  HGB 9.0* 8.7* 8.9*  HCT 26.4* 25.5* 26.8*  PLT 196 212 218    BMET   Recent Labs  08/27/15 0444 08/28/15 0428 08/29/15 0702  NA 136 143 144  K 6.2* 4.4 3.6  CL 100* 106 107  CO2 20* 21* 21*  GLUCOSE 157* 125* 165*  BUN 102* 99* 94*  CREATININE 2.93* 2.90* 3.09*  CALCIUM 8.0* 8.0* 8.1*    LFT   Recent Labs  08/26/15 1950 08/27/15 0444  PROT 5.5* 5.5*  ALBUMIN 2.6* 2.7*  AST 43* 199*  ALT 23 104*  ALKPHOS 60 65  BILITOT 0.7 2.1*    PT/INR   Recent Labs  08/28/15 0428  LABPROT 16.2*  INR 1.28    Hepatitis Panel  No results for input(s): HEPBSAG, HCVAB, HEPAIGM, HEPBIGM in the last 72 hours.   Assessment:  #1. GI bleed possibly from upper source. Patient has received 2 units of PRBCs. He is getting imperially treated for GERD and/or peptic ulcer disease. #2. Acute on chronic anemia secondary to GI bleed. Patient has received 2 units of PRBCs and hemoglobin is holding. #3. AKI. Dr. Fausto Skillern is treating patient with IV fluid and furosemide. #4. Malnutrition. Patient is back on gastric feeding. #5. Large hydropneumothorax. Infection versus neoplastic process. Ration not a candidate for further workup.  Recommendations:  Continue PPI current dose for 8 weeks and thereafter  once a day. Continue gastric feeding at current rate which is 60 ML/hour. Will sign off.

## 2015-08-29 NOTE — Progress Notes (Signed)
Updated [facility] that patient is not medically ready for dc as of today- they are able to accept back at time of dc-  Reece Levy, MSW, [place]  [phone]

## 2015-08-30 LAB — BASIC METABOLIC PANEL
ANION GAP: 13 (ref 5–15)
BUN: 92 mg/dL — AB (ref 6–20)
CHLORIDE: 110 mmol/L (ref 101–111)
CO2: 24 mmol/L (ref 22–32)
Calcium: 7.9 mg/dL — ABNORMAL LOW (ref 8.9–10.3)
Creatinine, Ser: 3 mg/dL — ABNORMAL HIGH (ref 0.61–1.24)
GFR calc Af Amer: 20 mL/min — ABNORMAL LOW (ref >60)
GFR calc non Af Amer: 17 mL/min — ABNORMAL LOW (ref >60)
Glucose, Bld: 140 mg/dL — ABNORMAL HIGH (ref 65–99)
POTASSIUM: 3.1 mmol/L — AB (ref 3.5–5.1)
SODIUM: 147 mmol/L — AB (ref 135–145)

## 2015-08-30 LAB — GLUCOSE, CAPILLARY
GLUCOSE-CAPILLARY: 111 mg/dL — AB (ref 65–99)
Glucose-Capillary: 110 mg/dL — ABNORMAL HIGH (ref 65–99)
Glucose-Capillary: 113 mg/dL — ABNORMAL HIGH (ref 65–99)
Glucose-Capillary: 120 mg/dL — ABNORMAL HIGH (ref 65–99)
Glucose-Capillary: 125 mg/dL — ABNORMAL HIGH (ref 65–99)
Glucose-Capillary: 183 mg/dL — ABNORMAL HIGH (ref 65–99)

## 2015-08-30 LAB — CBC
HEMATOCRIT: 27.8 % — AB (ref 39.0–52.0)
HEMOGLOBIN: 9.2 g/dL — AB (ref 13.0–17.0)
MCH: 29.6 pg (ref 26.0–34.0)
MCHC: 33.1 g/dL (ref 30.0–36.0)
MCV: 89.4 fL (ref 78.0–100.0)
Platelets: 235 10*3/uL (ref 150–400)
RBC: 3.11 MIL/uL — AB (ref 4.22–5.81)
RDW: 18.7 % — ABNORMAL HIGH (ref 11.5–15.5)
WBC: 14.6 10*3/uL — AB (ref 4.0–10.5)

## 2015-08-30 LAB — VANCOMYCIN, TROUGH: VANCOMYCIN TR: 33 ug/mL — AB (ref 10.0–20.0)

## 2015-08-30 MED ORDER — MILK AND MOLASSES ENEMA
1.0000 | Freq: Once | RECTAL | Status: AC
  Administered 2015-08-30: 250 mL via RECTAL

## 2015-08-30 MED ORDER — SODIUM CHLORIDE 0.45 % IV SOLN
INTRAVENOUS | Status: DC
  Administered 2015-08-30 – 2015-09-02 (×6): via INTRAVENOUS
  Filled 2015-08-30 (×11): qty 1000

## 2015-08-30 MED ORDER — VITAL AF 1.2 CAL PO LIQD
1000.0000 mL | ORAL | Status: DC
  Administered 2015-08-30 – 2015-08-31 (×2): 1000 mL
  Filled 2015-08-30 (×3): qty 1000

## 2015-08-30 MED ORDER — POTASSIUM CHLORIDE 20 MEQ PO PACK
40.0000 meq | PACK | ORAL | Status: AC
  Administered 2015-08-30 (×2): 40 meq via ORAL
  Filled 2015-08-30 (×2): qty 2

## 2015-08-30 MED ORDER — OXYCODONE HCL 5 MG PO TABS
5.0000 mg | ORAL_TABLET | ORAL | Status: DC | PRN
  Administered 2015-08-30 – 2015-09-03 (×4): 5 mg
  Filled 2015-08-30 (×4): qty 1

## 2015-08-30 NOTE — Progress Notes (Signed)
Vancomycin trough 33, Vancomycin held at this time.

## 2015-08-30 NOTE — Progress Notes (Signed)
Enema given. Pt had a moderate amount of a bloody bowel movement. Pt seems to feel better. MD notified. RN will continue to monitor.

## 2015-08-30 NOTE — Progress Notes (Signed)
[facility] PROGRESS NOTE  Dylan Walsh ZOX:[id number] DOB: 05-Jun-1925 DOA: 08/26/2015 PCP: No primary care provider on file.  Assessment/Plan: 1. Upper GI bleed with guaiac positive stool coming out of PEG tube. GI input appreciated. Will continue Protonix BID and tube feedings. No reported blood coming from PEG tube since admission. Will consider further diagnostic eval if hgb continues to trend down. 2. ABLA, Hgb 4.5 on admission. Baseline Hgb is unclear. Will try and request records from [facility]. Hgb stable following transfusion of 2U PRBCs. Will continue to monitor and transfuse if needed. No evidence of ongoing bleeding at this time.  3. Possible UTI. Patient has a chronic suprapubic catheter.  UC unrevealing.  4. Bacteremia. Blood cultures show 1/2 gram positive cocci however final results are pending. Lactic acid trending down, WBC improved at 14.6 today. Continue antibiotics for now until cultures have been identified. 5. Large right hydropneumothorax, revealed on abdominal CT. Patient does not appear to be in any respiratory distress at this time. Due to his advanced age and multiple medical problems, he is not a good candidate for surgical intervention. Will continue observation. Pulmonology following 6. Hyperkalemia, resolved with kayexalate. Today patient is hypokalemic ,likely due to lasix, will replace. 7. Acute renal failure, likely superimposed on some element of CKD. He has evidence of anasarca on CT imaging and also has atrophic kidneys. Nephrology input appreciated. Previous records requested but not received yet.  8. Dementia, appears to be at baseline.  9. Hypotension, patient initially hypotensive on admission which has resolved. Initially there was a concern for sepsis and he was started on broad spectrum abx and IV hydration. Blood cultures have been sent and have shown no growth to date. Hypotension may have been related to severe anemia and dehydration. Lactic acid  trending down with IVF.    Code Status: Full DVT prophylaxis: SCDs Family Communication: Patient is ward of state. Left voicemail with legal gaurdian to discuss code status. Due to his age and multiple medical problems, patient should be a DNR.  Disposition Plan: discharge back to SNF when improved   Consultants:  GI  Pulmonology  Nephrology   Procedures:  Transfused 2U PRBCs 1/24  Antibiotics:  Zosyn 1/24>>  Vancomycin 1/24>>  HPI/Subjective: Patient cannot provide any history  Objective: Filed Vitals:   08/29/15 2045 08/30/15 0500  BP: 163/67 158/84  Pulse: 93 96  Temp: 97.6 F (36.4 C) 97.9 F (36.6 C)  Resp: 20 20    Intake/Output Summary (Last 24 hours) at 08/30/15 0742 Last data filed at 08/30/15 0604  Gross per 24 hour  Intake 3642.08 ml  Output   1900 ml  Net 1742.08 ml   Filed Weights   08/28/15 0500 08/29/15 0550 08/30/15 0500  Weight: 53.7 kg (118 lb 6.2 oz) 53.162 kg (117 lb 3.2 oz) 55.339 kg (122 lb)    Exam:  General: NAD, looks comfortable Cardiovascular: RRR, S1, S2  Respiratory: diminished breath sounds on right side Abdomen: soft, non tender, no distention , bowel sounds normal, PEG tube and suprapubic catheter in place Musculoskeletal: no edema b/l   Data Reviewed: Basic Metabolic Panel:  Recent Labs Lab 08/26/15 1950 08/27/15 0444 08/28/15 0428 08/29/15 0702 08/30/15 0636  NA 135 136 143 144 147*  K 6.3* 6.2* 4.4 3.6 3.1*  CL 96* 100* 106 107 110  CO2 22 20* 21* 21* 24  GLUCOSE 183* 157* 125* 165* 140*  BUN 103* 102* 99* 94* 92*  CREATININE 3.03* 2.93* 2.90* 3.09* 3.00*  CALCIUM 8.1* 8.0* 8.0* 8.1* 7.9*   Liver Function Tests:  Recent Labs Lab 08/26/15 1950 08/27/15 0444  AST 43* 199*  ALT 23 104*  ALKPHOS 60 65  BILITOT 0.7 2.1*  PROT 5.5* 5.5*  ALBUMIN 2.6* 2.7*    Recent Labs Lab 08/26/15 1950  LIPASE 17    CBC:  Recent Labs Lab 08/26/15 1950 08/27/15 0444 08/28/15 0428 08/29/15 0702  08/30/15 0636  WBC 12.9* 14.6* 15.3* 15.1* 14.6*  NEUTROABS 10.7*  --   --   --   --   HGB 4.5* 9.0* 8.7* 8.9* 9.2*  HCT 14.0* 26.4* 25.5* 26.8* 27.8*  MCV 83.8 85.4 85.6 88.2 89.4  PLT 260 196 212 218 235    BNP (last 3 results)  Recent Labs  08/26/15 1950  BNP 2548.0*     Recent Results (from the past 240 hour(s))  Urine culture     Status: None   Collection Time: 08/26/15  8:00 PM  Result Value Ref Range Status   Specimen Description URINE, CLEAN CATCH  Final   Special Requests NONE  Final   Culture   Final    >=100,000 COLONIES/mL YEAST Performed at [facility]    Report Status 08/28/2015 FINAL  Final  Blood culture (routine x 2)     Status: None (Preliminary result)   Collection Time: 08/26/15  8:20 PM  Result Value Ref Range Status   Specimen Description BLOOD LEFT HAND  Final   Special Requests BOTTLES DRAWN AEROBIC ONLY 6CC  Final   Culture  Setup Time   Final    GRAM POSITIVE COCCI IN CLUSTERS Gram Stain Report Called to,Read Back By and Verified With: HANDY, T. AT 2243 ON 08/29/2015 BY AGUNDIZ, E.   Culture NO GROWTH 3 DAYS  Final   Report Status PENDING  Incomplete  Blood culture (routine x 2)     Status: None (Preliminary result)   Collection Time: 08/26/15  8:35 PM  Result Value Ref Range Status   Specimen Description BLOOD LEFT HAND  Final   Special Requests BOTTLES DRAWN AEROBIC ONLY 6CC  Final   Culture NO GROWTH 3 DAYS  Final   Report Status PENDING  Incomplete  MRSA PCR Screening     Status: Abnormal   Collection Time: 08/26/15 11:30 PM  Result Value Ref Range Status   MRSA by PCR POSITIVE (A) NEGATIVE Final    Comment:        The GeneXpert MRSA Assay (FDA approved for NASAL specimens only), is one component of a comprehensive MRSA colonization surveillance program. It is not intended to diagnose MRSA infection nor to guide or monitor treatment for MRSA infections. RESULT CALLED TO, READ BACK BY AND VERIFIED WITH: HAMMOCK S AT 0315  ON [id number] BY FORSYTH K   Wound culture     Status: None (Preliminary result)   Collection Time: 08/28/15 10:00 AM  Result Value Ref Range Status   Specimen Description WOUND DRAINAGE PEG TUBE SITE  Final   Special Requests NONE  Final   Gram Stain   Final    NO WBC SEEN NO SQUAMOUS EPITHELIAL CELLS SEEN ABUNDANT YEAST Performed at [facility]    Culture   Final    ABUNDANT YEAST Performed at [facility]    Report Status PENDING  Incomplete     Studies: No results found.  Scheduled Meds: . antiseptic oral rinse  7 mL Mouth Rinse q12n4p  . chlorhexidine  15 mL Mouth Rinse  BID  . Chlorhexidine Gluconate Cloth  6 each Topical Q0600  . furosemide  40 mg Intravenous BID  . insulin aspart  0-9 Units Subcutaneous 6 times per day  . mupirocin ointment  1 application Nasal BID  . pantoprazole (PROTONIX) IV  40 mg Intravenous Q12H  . piperacillin-tazobactam (ZOSYN)  IV  2.25 g Intravenous 3 times per day  . polyethylene glycol  17 g Oral BID  . vancomycin  750 mg Intravenous Q48H   Continuous Infusions: . sodium chloride 100 mL/hr at 08/30/15 0357  . feeding supplement (VITAL AF 1.2 CAL) 1,000 mL (08/28/15 1416)    Active Problems:   GI bleed   Sepsis (HCC)   Dementia   Anemia   UGIB (upper gastrointestinal bleed)   Pneumothorax, right   Hyperkalemia   Acute renal failure (HCC)   Acute blood loss anemia   Pressure ulcer   Malnutrition of moderate degree   Time spent: 25 minutes  Olena Willy. MD [facility] Pager [phone]. If 7PM-7AM, please contact night-coverage at [email or web address], password [facility] 08/30/2015, 7:42 AM  LOS: 4 days

## 2015-08-30 NOTE — Progress Notes (Signed)
Pt was moaning out saying " my belly hurts" RN checked residual and it was 10cc. Belly was taunt but bowel sounds were faintly heard. RN gave miralax in hope to have a bowel movement. RN notified MD about pain and orders were placed. Pain medication was given. MD Befekadu saw patient while RN was in the room and put order in to slow feeding rate down to 40 ml/hr. RN will continue to monitor patient.

## 2015-08-30 NOTE — Progress Notes (Signed)
Subjective: Patient is alert and in no apparent distress. No bleeding according to the nurses however patient seems to be morning when his abdomen is being touched. He did move his bowels.  Objective: Vital signs in last 24 hours: Temp:  [97.6 F (36.4 C)-97.9 F (36.6 C)] 97.9 F (36.6 C) (01/28 0500) Pulse Rate:  [87-96] 96 (01/28 0500) Resp:  [20] 20 (01/28 0500) BP: (154-163)/(67-84) 158/84 mmHg (01/28 0500) SpO2:  [91 %-98 %] 98 % (01/28 0500) Weight:  [122 lb (55.339 kg)] 122 lb (55.339 kg) (01/28 0500)  Intake/Output from previous day: 01/27 0701 - 01/28 0700 In: 3642.1 [I.V.:3542.1; IV Piggyback:100] Out: 1900 [Urine:1900] Intake/Output this shift:     Recent Labs  08/28/15 0428 08/29/15 0702 08/30/15 0636  HGB 8.7* 8.9* 9.2*    Recent Labs  08/29/15 0702 08/30/15 0636  WBC 15.1* 14.6*  RBC 3.04* 3.11*  HCT 26.8* 27.8*  PLT 218 235    Recent Labs  08/29/15 0702 08/30/15 0636  NA 144 147*  K 3.6 3.1*  CL 107 110  CO2 21* 24  BUN 94* 92*  CREATININE 3.09* 3.00*  GLUCOSE 165* 140*  CALCIUM 8.1* 7.9*    Recent Labs  08/28/15 0428  INR 1.28   Generally patient seems to be more alert Chest: Decreased breath some bilaterally, no wheezing Heart exam regular rate and rhythm Abdomen: Patient seems to have some tenderness, no distended and positive bowel sound. Patient seems to have some leak around his gastric feeding tube Extremities: No edema patient with multiple pressure ulcers on his toes  Assessment/Plan: Problem #1 acute kidney injury: Possibly secondary to sepsis/prerenal syndrome . His renal function showed slight improvement. Presently he is nonoliguric with 1900 mL of urine output. Problem #2 hypokalemia: His potassium has declined today. Problem #3 hypertension: His blood pressure is better today. Problem #4 anemia: Secondary to GI bleeding. His hemoglobin is low but stable Problem #5 dementia Problem #6 history of sepsis: Possibly  UTI/pressure soar. Presently he is on antibiotics. He shouldn't is a febrile but his white blood cell count remains high. Problem #7 right large hydropneumothorax Problem #8 hypenatremia: Most likely from lack of free water. Plan: 1] will DC normal saline 2] will decrease feeding history of to 40 mL per hour until patient is seen by GI or his PCP 3] will start patient on half-normal saline with 10 mill quadrant of KCl at 100 mL per hour 4] will check his basic metabolic panel   Aliyana Dlugosz S 08/30/2015, 8:28 AM

## 2015-08-31 ENCOUNTER — Inpatient Hospital Stay (HOSPITAL_COMMUNITY): Payer: Medicare Other

## 2015-08-31 LAB — CBC
HEMATOCRIT: 27.3 % — AB (ref 39.0–52.0)
Hemoglobin: 8.9 g/dL — ABNORMAL LOW (ref 13.0–17.0)
MCH: 29.3 pg (ref 26.0–34.0)
MCHC: 32.6 g/dL (ref 30.0–36.0)
MCV: 89.8 fL (ref 78.0–100.0)
PLATELETS: 227 10*3/uL (ref 150–400)
RBC: 3.04 MIL/uL — ABNORMAL LOW (ref 4.22–5.81)
RDW: 19.8 % — AB (ref 11.5–15.5)
WBC: 14.2 10*3/uL — ABNORMAL HIGH (ref 4.0–10.5)

## 2015-08-31 LAB — GLUCOSE, CAPILLARY
GLUCOSE-CAPILLARY: 133 mg/dL — AB (ref 65–99)
GLUCOSE-CAPILLARY: 142 mg/dL — AB (ref 65–99)
Glucose-Capillary: 130 mg/dL — ABNORMAL HIGH (ref 65–99)
Glucose-Capillary: 132 mg/dL — ABNORMAL HIGH (ref 65–99)
Glucose-Capillary: 112 mg/dL — ABNORMAL HIGH (ref 65–99)
Glucose-Capillary: 198 mg/dL — ABNORMAL HIGH (ref 65–99)

## 2015-08-31 LAB — WOUND CULTURE: GRAM STAIN: NONE SEEN

## 2015-08-31 LAB — BASIC METABOLIC PANEL
Anion gap: 14 (ref 5–15)
BUN: 89 mg/dL — AB (ref 6–20)
CHLORIDE: 107 mmol/L (ref 101–111)
CO2: 22 mmol/L (ref 22–32)
CREATININE: 2.77 mg/dL — AB (ref 0.61–1.24)
Calcium: 8 mg/dL — ABNORMAL LOW (ref 8.9–10.3)
GFR calc Af Amer: 22 mL/min — ABNORMAL LOW (ref >60)
GFR, EST NON AFRICAN AMERICAN: 19 mL/min — AB (ref >60)
GLUCOSE: 132 mg/dL — AB (ref 65–99)
POTASSIUM: 4.7 mmol/L (ref 3.5–5.1)
Sodium: 143 mmol/L (ref 135–145)

## 2015-08-31 LAB — CULTURE, BLOOD (ROUTINE X 2)

## 2015-08-31 MED ORDER — DIATRIZOATE MEGLUMINE & SODIUM 66-10 % PO SOLN
ORAL | Status: AC
  Administered 2015-08-31: 30 mL
  Filled 2015-08-31: qty 30

## 2015-08-31 NOTE — Plan of Care (Signed)
Problem: Bowel/Gastric: Goal: Will not experience complications related to bowel motility Outcome: Progressing Pt is having less bowel movements with blood

## 2015-08-31 NOTE — Progress Notes (Addendum)
[facility] PROGRESS NOTE  Dylan Walsh ZOX:[id number] DOB: 1925/06/03 DOA: 08/26/2015 PCP: No primary care provider on file.  Assessment/Plan: 1. Upper GI bleed with guaiac positive stool coming out of PEG tube. GI input appreciated. Will continue Protonix BID and tube feedings. No reported blood coming from PEG tube since admission. Will consider further diagnostic eval if hgb continues to trend down. Will check PEG tube placement, since it was reported that tube feeding was leaking around the tube.  2. ABLA, Hgb 4.5 on admission. Baseline Hgb is unclear. Will try and request records from [facility]. Hgb stable following transfusion of 2U PRBCs. Will continue to monitor and transfuse if needed. No evidence of ongoing bleeding at this time.  3. Bacteremia. Blood cultures show 1/2 cultures positive for coagulase negative staph. likely a contaminant  gram positive cocci however final results are pending. Lactic acid trending down, WBC improved. Vancomycin discontinued. 4. Large right hydropneumothorax, revealed on abdominal CT. Patient does not appear to be in any respiratory distress at this time. Due to his advanced age and multiple medical problems, he is not a good candidate for surgical intervention. Will continue observation. Pulmonology following 5. Hyperkalemia, resolved with kayexalate.  6. Acute renal failure, likely superimposed on some element of CKD. He has evidence of anasarca on CT imaging and also has atrophic kidneys. Nephrology input appreciated. Previous records requested but not received yet. Renal function improving with IVF and lasix. 7. Dementia, appears to be at baseline.  8. Hypotension, patient initially hypotensive on admission which has resolved. Initially there was a concern for sepsis and he was started on broad spectrum abx and IV hydration. Vancomycin discontinued. Blood cultures have been sent and have shown no growth to date. Hypotension may have been related to  severe anemia and dehydration. Lactic acid trending down with IVF.    Code Status: Full DVT prophylaxis: SCDs Family Communication: Patient is ward of state. Left another voicemail with legal gaurdian to discuss code status. Due to his age and multiple medical problems, patient should be a DNR.  Disposition Plan: discharge back to SNF when improved   Consultants:  GI  Pulmonology  Nephrology   Procedures:  Transfused 2U PRBCs 1/24  Antibiotics:  Zosyn 1/24>>  Vancomycin 1/24>>1/28  HPI/Subjective: Legs and feet are sore.   Objective: Filed Vitals:   08/30/15 2022 08/31/15 0632  BP: 172/102 145/74  Pulse: 84 94  Temp: 97.8 F (36.6 C) 98.4 F (36.9 C)  Resp: 20 20    Intake/Output Summary (Last 24 hours) at 08/31/15 0719 Last data filed at 08/31/15 8119  Gross per 24 hour  Intake   2945 ml  Output   2050 ml  Net    895 ml   Filed Weights   08/29/15 0550 08/30/15 0500 08/31/15 0406  Weight: 53.162 kg (117 lb 3.2 oz) 55.339 kg (122 lb) 55.112 kg (121 lb 8 oz)    Exam:  General: NAD Cardiovascular: RRR, S1, S2  Respiratory: Bronchial breath sounds at the right base.  Abdomen: soft, non tender, no distention , bowel sounds normal, PEG tube and suprapubic catheter in place Musculoskeletal: no edema b/l   Data Reviewed: Basic Metabolic Panel:  Recent Labs Lab 08/26/15 1950 08/27/15 0444 08/28/15 0428 08/29/15 0702 08/30/15 0636  NA 135 136 143 144 147*  K 6.3* 6.2* 4.4 3.6 3.1*  CL 96* 100* 106 107 110  CO2 22 20* 21* 21* 24  GLUCOSE 183* 157* 125* 165* 140*  BUN 103* 102*  99* 94* 92*  CREATININE 3.03* 2.93* 2.90* 3.09* 3.00*  CALCIUM 8.1* 8.0* 8.0* 8.1* 7.9*   Liver Function Tests:  Recent Labs Lab 08/26/15 1950 08/27/15 0444  AST 43* 199*  ALT 23 104*  ALKPHOS 60 65  BILITOT 0.7 2.1*  PROT 5.5* 5.5*  ALBUMIN 2.6* 2.7*    Recent Labs Lab 08/26/15 1950  LIPASE 17    CBC:  Recent Labs Lab 08/26/15 1950 08/27/15 0444  08/28/15 0428 08/29/15 0702 08/30/15 0636  WBC 12.9* 14.6* 15.3* 15.1* 14.6*  NEUTROABS 10.7*  --   --   --   --   HGB 4.5* 9.0* 8.7* 8.9* 9.2*  HCT 14.0* 26.4* 25.5* 26.8* 27.8*  MCV 83.8 85.4 85.6 88.2 89.4  PLT 260 196 212 218 235    BNP (last 3 results)  Recent Labs  08/26/15 1950  BNP 2548.0*     Recent Results (from the past 240 hour(s))  Urine culture     Status: None   Collection Time: 08/26/15  8:00 PM  Result Value Ref Range Status   Specimen Description URINE, CLEAN CATCH  Final   Special Requests NONE  Final   Culture   Final    >=100,000 COLONIES/mL YEAST Performed at [facility]    Report Status 08/28/2015 FINAL  Final  Blood culture (routine x 2)     Status: None (Preliminary result)   Collection Time: 08/26/15  8:20 PM  Result Value Ref Range Status   Specimen Description BLOOD LEFT HAND  Final   Special Requests BOTTLES DRAWN AEROBIC ONLY 6CC  Final   Culture  Setup Time   Final    GRAM POSITIVE COCCI IN CLUSTERS Gram Stain Report Called to,Read Back By and Verified With: HANDY, T. AT 2243 ON 08/29/2015 BY AGUNDIZ, E.   Culture NO GROWTH 4 DAYS  Final   Report Status PENDING  Incomplete  Blood culture (routine x 2)     Status: None (Preliminary result)   Collection Time: 08/26/15  8:35 PM  Result Value Ref Range Status   Specimen Description BLOOD LEFT HAND  Final   Special Requests BOTTLES DRAWN AEROBIC ONLY 6CC  Final   Culture NO GROWTH 4 DAYS  Final   Report Status PENDING  Incomplete  MRSA PCR Screening     Status: Abnormal   Collection Time: 08/26/15 11:30 PM  Result Value Ref Range Status   MRSA by PCR POSITIVE (A) NEGATIVE Final    Comment:        The GeneXpert MRSA Assay (FDA approved for NASAL specimens only), is one component of a comprehensive MRSA colonization surveillance program. It is not intended to diagnose MRSA infection nor to guide or monitor treatment for MRSA infections. RESULT CALLED TO, READ BACK BY AND  VERIFIED WITH: HAMMOCK S AT 0315 ON [id number] BY FORSYTH K   Wound culture     Status: None (Preliminary result)   Collection Time: 08/28/15 10:00 AM  Result Value Ref Range Status   Specimen Description WOUND DRAINAGE PEG TUBE SITE  Final   Special Requests NONE  Final   Gram Stain   Final    NO WBC SEEN NO SQUAMOUS EPITHELIAL CELLS SEEN ABUNDANT YEAST Performed at [facility]    Culture   Final    ABUNDANT YEAST Performed at [facility]    Report Status PENDING  Incomplete     Studies: No results found.  Scheduled Meds: . antiseptic oral rinse  7 mL  Mouth Rinse q12n4p  . chlorhexidine  15 mL Mouth Rinse BID  . furosemide  40 mg Intravenous BID  . insulin aspart  0-9 Units Subcutaneous 6 times per day  . mupirocin ointment  1 application Nasal BID  . pantoprazole (PROTONIX) IV  40 mg Intravenous Q12H  . piperacillin-tazobactam (ZOSYN)  IV  2.25 g Intravenous 3 times per day  . polyethylene glycol  17 g Oral BID  . vancomycin  750 mg Intravenous Q48H   Continuous Infusions: . feeding supplement (VITAL AF 1.2 CAL) 1,000 mL (08/30/15 1121)  . sodium chloride 0.45 % with kcl 100 mL/hr at 08/30/15 2251    Active Problems:   GI bleed   Sepsis (HCC)   Dementia   Anemia   UGIB (upper gastrointestinal bleed)   Pneumothorax, right   Hyperkalemia   Acute renal failure (HCC)   Acute blood loss anemia   Pressure ulcer   Malnutrition of moderate degree   Time spent: 25 minutes  Trypp Heckmann. MD [facility] Pager [phone]. If 7PM-7AM, please contact night-coverage at [email or web address], password [facility] 08/31/2015, 7:19 AM  LOS: 5 days     By signing my name below, I, Zadie Cleverly, attest that this documentation has been prepared under the direction and in the presence of Erick Blinks, MD. Electronically signed: Zadie Cleverly, Scribe. 08/31/2015 1:50pm.   I, Dr. Erick Blinks, personally performed the services described in this  documentaiton. All medical record entries made by the scribe were at my direction and in my presence. I have reviewed the chart and agree that the record reflects my personal performance and is accurate and complete  Erick Blinks, MD, 08/31/2015 2:02 PM

## 2015-08-31 NOTE — Progress Notes (Signed)
ANTIBIOTIC CONSULT NOTE-  Pharmacy Consult for Vancomycin and Zosyn Indication: Sepsis  No Known Allergies  Patient Measurements: Height:  (167.6 cm) Weight: 121 lb 8 oz (55.112 kg) IBW/kg (Calculated) : 63.8  Vital Signs: Temp: 98.4 F (36.9 C) (01/29 0632) Temp Source: Oral (01/29 4098) BP: 145/74 mmHg (01/29 1191) Pulse Rate: 94 (01/29 0632)  Labs:  Recent Labs  08/29/15 0702 08/30/15 0636  WBC 15.1* 14.6*  HGB 8.9* 9.2*  PLT 218 235  CREATININE 3.09* 3.00*   Estimated Creatinine Clearance: 13.3 mL/min (by C-G formula based on Cr of 3).   Recent Labs  08/30/15 1935  VANCOTROUGH 33*    Microbiology: Recent Results (from the past 720 hour(s))  Urine culture     Status: None   Collection Time: 08/26/15  8:00 PM  Result Value Ref Range Status   Specimen Description URINE, CLEAN CATCH  Final   Special Requests NONE  Final   Culture   Final    >=100,000 COLONIES/mL YEAST Performed at [facility]    Report Status 08/28/2015 FINAL  Final  Blood culture (routine x 2)     Status: None   Collection Time: 08/26/15  8:20 PM  Result Value Ref Range Status   Specimen Description BLOOD LEFT HAND  Final   Special Requests BOTTLES DRAWN AEROBIC ONLY 6CC  Final   Culture  Setup Time   Final    GRAM POSITIVE COCCI IN CLUSTERS Gram Stain Report Called to,Read Back By and Verified With: HANDY, T. AT 2243 ON 08/29/2015 BY AGUNDIZ, E.    Culture   Final    STAPHYLOCOCCUS SPECIES (COAGULASE NEGATIVE) THE SIGNIFICANCE OF ISOLATING THIS ORGANISM FROM A SINGLE SET OF BLOOD CULTURES WHEN MULTIPLE SETS ARE DRAWN IS UNCERTAIN. PLEASE NOTIFY THE MICROBIOLOGY DEPARTMENT WITHIN ONE WEEK IF SPECIATION AND SENSITIVITIES ARE REQUIRED. Performed at [facility]    Report Status 08/31/2015 FINAL  Final  Blood culture (routine x 2)     Status: None (Preliminary result)   Collection Time: 08/26/15  8:35 PM  Result Value Ref Range Status   Specimen Description BLOOD  LEFT HAND  Final   Special Requests BOTTLES DRAWN AEROBIC ONLY 6CC  Final   Culture NO GROWTH 4 DAYS  Final   Report Status PENDING  Incomplete  MRSA PCR Screening     Status: Abnormal   Collection Time: 08/26/15 11:30 PM  Result Value Ref Range Status   MRSA by PCR POSITIVE (A) NEGATIVE Final    Comment:        The GeneXpert MRSA Assay (FDA approved for NASAL specimens only), is one component of a comprehensive MRSA colonization surveillance program. It is not intended to diagnose MRSA infection nor to guide or monitor treatment for MRSA infections. RESULT CALLED TO, READ BACK BY AND VERIFIED WITH: HAMMOCK S AT 0315 ON [id number] BY FORSYTH K   Wound culture     Status: None   Collection Time: 08/28/15 10:00 AM  Result Value Ref Range Status   Specimen Description WOUND DRAINAGE PEG TUBE SITE  Final   Special Requests NONE  Final   Gram Stain   Final    NO WBC SEEN NO SQUAMOUS EPITHELIAL CELLS SEEN ABUNDANT YEAST Performed at [facility]    Culture   Final    ABUNDANT YEAST Performed at [facility]    Report Status 08/31/2015 FINAL  Final   Medical History: Past Medical History  Diagnosis Date  . Hypertension   .  Coronary artery disease   . Dementia   . GERD (gastroesophageal reflux disease)   . UTI (lower urinary tract infection)   . Gastrostomy in place [facility])   . Dysphagia    ABX given on admission:    Vancomycin 1 Gm IV given in the ED at 2015 08/26/15 Zosyn 3.375 Gm IV given in the Ed at 2015 08/26/15  Assessment: 82 yo male, resident of SNF brought to the ED for hypotension and increased G-tube output. Pt has elevated WBC. G-tube output appeared blood tinged, with feculent material. Pt to be started on antibiotics empirically for sepsis.  SCr elevated.   Vancomycin trough level is ELEVATED.   Goal of Therapy:  Vancomycin troughs 15-20 mcg/ml Eradicate infection  Plan - Maintenance doses of ABX: HOLD Vancomycin for now Recheck Random  level in am to evaluate clearance / kinetics / dosing strategy Monitor SCr Continue Zosyn 2.25gm IV q8h Monitor labs, renal fxn, progress and c/s Deescalate ABX when improved / appropriate  Margo Aye, Vandell Kun A, RPH 08/31/2015,10:16 AM

## 2015-08-31 NOTE — Progress Notes (Signed)
Subjective: Patient is alert and talking but unfortunately very difficult to understand what he is talking about.  Objective: Vital signs in last 24 hours: Temp:  [97.8 F (36.6 C)-98.4 F (36.9 C)] 98.4 F (36.9 C) (01/29 1914) Pulse Rate:  [84-94] 94 (01/29 0632) Resp:  [20] 20 (01/29 7829) BP: (137-172)/(74-102) 145/74 mmHg (01/29 0632) SpO2:  [89 %-98 %] 98 % (01/29 5621) Weight:  [121 lb 8 oz (55.112 kg)] 121 lb 8 oz (55.112 kg) (01/29 0406)  Intake/Output from previous day: 01/28 0701 - 01/29 0700 In: 2945 [I.V.:1910; NG/GT:285; IV Piggyback:150] Out: 2050 [Urine:2050] Intake/Output this shift:     Recent Labs  08/29/15 0702 08/30/15 0636  HGB 8.9* 9.2*    Recent Labs  08/29/15 0702 08/30/15 0636  WBC 15.1* 14.6*  RBC 3.04* 3.11*  HCT 26.8* 27.8*  PLT 218 235    Recent Labs  08/29/15 0702 08/30/15 0636  NA 144 147*  K 3.6 3.1*  CL 107 110  CO2 21* 24  BUN 94* 92*  CREATININE 3.09* 3.00*  GLUCOSE 165* 140*  CALCIUM 8.1* 7.9*   No results for input(s): LABPT, INR in the last 72 hours. Generally patient seems to be more alert and in no apparent distress Chest: Decreased breath some bilaterally, no wheezing Heart exam regular rate and rhythm Abdomen: Patient seems to have some tenderness, no distended and positive bowel sound. Patient seems to have some leak around his gastric feeding tube Extremities: No edema patient with multiple pressure ulcers on his toes  Assessment/Plan: Problem #1 acute kidney injury: Possibly secondary to sepsis/prerenal syndrome . Marland Kitchen His urine output however seems to be improving. Had about 2000 mL of urine output the last 24 hours.His renal function is slowly improving Problem #2 hypokalemia: Patient has been put on replacement. His potassium has normalized Problem #3 hypertension: His blood pressure is better today. Problem #4 anemia: Secondary to GI bleeding. His hemoglobin is has improved . Problem #5 dementia Problem  #6 history of sepsis: Possibly UTI/pressure soar. Presently he is on antibiotics. He shouldn't is a febrile but his white blood cell count remains high. Problem #7 right large hydropneumothorax Problem #8 hypenatremia: His sodium has come to normal level Plan: 1] will continue his present management 2] will check his basic metabolic panel   Brettney Ficken S 08/31/2015, 9:03 AM

## 2015-09-01 LAB — BASIC METABOLIC PANEL
Anion gap: 13 (ref 5–15)
BUN: 86 mg/dL — AB (ref 6–20)
CALCIUM: 7.9 mg/dL — AB (ref 8.9–10.3)
CO2: 24 mmol/L (ref 22–32)
CREATININE: 2.78 mg/dL — AB (ref 0.61–1.24)
Chloride: 104 mmol/L (ref 101–111)
GFR calc non Af Amer: 19 mL/min — ABNORMAL LOW (ref >60)
GFR, EST AFRICAN AMERICAN: 22 mL/min — AB (ref >60)
Glucose, Bld: 167 mg/dL — ABNORMAL HIGH (ref 65–99)
Potassium: 3.8 mmol/L (ref 3.5–5.1)
SODIUM: 141 mmol/L (ref 135–145)

## 2015-09-01 LAB — GLUCOSE, CAPILLARY
GLUCOSE-CAPILLARY: 139 mg/dL — AB (ref 65–99)
GLUCOSE-CAPILLARY: 141 mg/dL — AB (ref 65–99)
GLUCOSE-CAPILLARY: 186 mg/dL — AB (ref 65–99)
Glucose-Capillary: 168 mg/dL — ABNORMAL HIGH (ref 65–99)
Glucose-Capillary: 200 mg/dL — ABNORMAL HIGH (ref 65–99)
Glucose-Capillary: 238 mg/dL — ABNORMAL HIGH (ref 65–99)

## 2015-09-01 LAB — CBC
HCT: 24.4 % — ABNORMAL LOW (ref 39.0–52.0)
Hemoglobin: 8 g/dL — ABNORMAL LOW (ref 13.0–17.0)
MCH: 29.9 pg (ref 26.0–34.0)
MCHC: 32.8 g/dL (ref 30.0–36.0)
MCV: 91 fL (ref 78.0–100.0)
PLATELETS: 189 10*3/uL (ref 150–400)
RBC: 2.68 MIL/uL — ABNORMAL LOW (ref 4.22–5.81)
RDW: 20.1 % — AB (ref 11.5–15.5)
WBC: 12.3 10*3/uL — ABNORMAL HIGH (ref 4.0–10.5)

## 2015-09-01 LAB — CULTURE, BLOOD (ROUTINE X 2): Culture: NO GROWTH

## 2015-09-01 LAB — VANCOMYCIN, RANDOM: VANCOMYCIN RM: 28 ug/mL

## 2015-09-01 MED ORDER — VITAL AF 1.2 CAL PO LIQD
1000.0000 mL | ORAL | Status: DC
  Administered 2015-09-01 – 2015-09-02 (×2): 1000 mL
  Filled 2015-09-01 (×5): qty 1000

## 2015-09-01 NOTE — Progress Notes (Signed)
[facility] PROGRESS NOTE  Austin Herd ZOX:[id number] DOB: [birth year] DOA: 08/26/2015 PCP: No primary care provider on file.  Assessment/Plan: 1. Upper GI bleed with guaiac positive stool coming out of PEG tube. GI input appreciated. Will continue Protonix BID and tube feedings. No reported blood coming from PEG tube since admission. Would avoid invasive diagnostic testing considering patient's age and co morbidities. Hemoglobin is stable at this time.   2. ABLA, Hgb 4.5 on admission. Baseline Hgb is unclear. Have not received records from [facility]. Hgb slowly trending down following transfusion of 2U PRBCs. Will continue to monitor and transfuse if needed. No evidence of ongoing bleeding at this time. Down trend may be related to hemodilution. 3. Bacteremia. Blood cultures show 1/2 cultures positive for coagulase negative staph which is likely a contaminant. Lactic acid trending down, WBC continues to also trend down.  4. Large right hydropneumothorax, revealed on abdominal CT and chest xray. Patient does not appear to be in any respiratory distress at this time. Due to his advanced age and multiple medical problems, he is not a good candidate for surgical intervention, including chest tube. Discussed with Dr. Tyron Russell, Radiology who felt that draining pleural effusion would worsen the pneumothorax and advised against it. Discussed with Dr. Juanetta Gosling, Pulmonologist who has been following patient, who recommended continued observation and repeat chest xray in 1 month.  5. Hyperkalemia, resolved with kayexalate.  6. Acute renal failure, likely superimposed on some degree of CKD. He has evidence of anasarca on CT imaging and also has atrophic kidneys. Nephrology input appreciated. Previous records requested but not received yet. Creatinine is stable 7. Dementia, appears to be at baseline. Patient is bed bound and dependent on feeding from PEG tube. He also has a suprapubic catheter. 8. Hypotension,  patient initially was hypotensive on admission which has resolved. Initially there was a concern for sepsis and he was started on broad spectrum abx and IV hydration.  Hypotension may have been related to severe anemia and dehydration. Lactic acid trending down with IVF. Blood pressure now stable   Code Status: Full DVT prophylaxis: SCDs Family Communication: Discussed patient's condition with guardian at social services. Due to patient's advanced age, multiple co morbidities, and overall quality of life; in the event he goes into cardiopulmonary arrest, I feel that it is very unlikely that performing CPR or other heroic measures would result in any meaningful recovery and would likely only prolong suffering. I strongly recommend that patient should have a DO NOT RESUSCITATE order in place, and in fact, would be appropriate for comfort care/hospice services. I have also discussed this with Dr. Juanetta Gosling who shares my views.   Disposition Plan: discharge back to SNF when improved   Consultants:  GI  Pulmonology  Nephrology   Procedures:  Transfused 2U PRBCs 1/24  Antibiotics:  Zosyn 1/24>> 1/28  Vancomycin 1/24>>1/28  HPI/Subjective: No new complaints  Objective: Filed Vitals:   09/01/15 0629 09/01/15 0854  BP: 137/67 136/69  Pulse: 82 88  Temp: 98.5 F (36.9 C) 98.2 F (36.8 C)  Resp: 20 20    Intake/Output Summary (Last 24 hours) at 09/01/15 1211 Last data filed at 09/01/15 0945  Gross per 24 hour  Intake   1150 ml  Output   3360 ml  Net  -2210 ml   Filed Weights   08/31/15 0406 09/01/15 0512 09/01/15 0629  Weight: 55.112 kg (121 lb 8 oz) 53.7 kg (118 lb 6.2 oz) 53.4 kg (117 lb 11.6 oz)  Exam:  General: NAD Cardiovascular: RRR, S1, S2  Respiratory: Bronchial breath sounds at the right base.  Abdomen: soft, non tender, no distention , bowel sounds normal, PEG tube and suprapubic catheter in place Musculoskeletal: no edema b/l   Data Reviewed: Basic  Metabolic Panel:  Recent Labs Lab 08/28/15 0428 08/29/15 0702 08/30/15 0636 08/31/15 1201 09/01/15 0652  NA 143 144 147* 143 141  K 4.4 3.6 3.1* 4.7 3.8  CL 106 107 110 107 104  CO2 21* 21* GLUCOSE 125* 165* 140* 132* 167*  BUN 99* 94* 92* 89* 86*  CREATININE 2.90* 3.09* 3.00* 2.77* 2.78*  CALCIUM 8.0* 8.1* 7.9* 8.0* 7.9*   Liver Function Tests:  Recent Labs Lab 08/26/15 1950 08/27/15 0444  AST 43* 199*  ALT 23 104*  ALKPHOS 60 65  BILITOT 0.7 2.1*  PROT 5.5* 5.5*  ALBUMIN 2.6* 2.7*    Recent Labs Lab 08/26/15 1950  LIPASE 17    CBC:  Recent Labs Lab 08/26/15 1950  08/28/15 0428 08/29/15 0702 08/30/15 0636 08/31/15 1201 09/01/15 0652  WBC 12.9*  < > 15.3* 15.1* 14.6* 14.2* 12.3*  NEUTROABS 10.7*  --   --   --   --   --   --   HGB 4.5*  < > 8.7* 8.9* 9.2* 8.9* 8.0*  HCT 14.0*  < > 25.5* 26.8* 27.8* 27.3* 24.4*  MCV 83.8  < > 85.6 88.2 89.4 89.8 91.0  PLT 260  < > 212 218 235 227 189  < > = values in this interval not displayed.  BNP (last 3 results)  Recent Labs  08/26/15 1950  BNP 2548.0*     Recent Results (from the past 240 hour(s))  Urine culture     Status: None   Collection Time: 08/26/15  8:00 PM  Result Value Ref Range Status   Specimen Description URINE, CLEAN CATCH  Final   Special Requests NONE  Final   Culture   Final    >=100,000 COLONIES/mL YEAST Performed at [facility]    Report Status 08/28/2015 FINAL  Final  Blood culture (routine x 2)     Status: None   Collection Time: 08/26/15  8:20 PM  Result Value Ref Range Status   Specimen Description BLOOD LEFT HAND  Final   Special Requests BOTTLES DRAWN AEROBIC ONLY 6CC  Final   Culture  Setup Time   Final    GRAM POSITIVE COCCI IN CLUSTERS Gram Stain Report Called to,Read Back By and Verified With: HANDY, T. AT 2243 ON 08/29/2015 BY AGUNDIZ, E.    Culture   Final    STAPHYLOCOCCUS SPECIES (COAGULASE NEGATIVE) THE SIGNIFICANCE OF ISOLATING THIS ORGANISM  FROM A SINGLE SET OF BLOOD CULTURES WHEN MULTIPLE SETS ARE DRAWN IS UNCERTAIN. PLEASE NOTIFY THE MICROBIOLOGY DEPARTMENT WITHIN ONE WEEK IF SPECIATION AND SENSITIVITIES ARE REQUIRED. Performed at [facility]    Report Status 08/31/2015 FINAL  Final  Blood culture (routine x 2)     Status: None   Collection Time: 08/26/15  8:35 PM  Result Value Ref Range Status   Specimen Description BLOOD LEFT HAND  Final   Special Requests BOTTLES DRAWN AEROBIC ONLY 6CC  Final   Culture NO GROWTH 6 DAYS  Final   Report Status 09/01/2015 FINAL  Final  MRSA PCR Screening     Status: Abnormal   Collection Time: 08/26/15 11:30 PM  Result Value Ref Range Status   MRSA by PCR POSITIVE (A)  NEGATIVE Final    Comment:        The GeneXpert MRSA Assay (FDA approved for NASAL specimens only), is one component of a comprehensive MRSA colonization surveillance program. It is not intended to diagnose MRSA infection nor to guide or monitor treatment for MRSA infections. RESULT CALLED TO, READ BACK BY AND VERIFIED WITH: HAMMOCK S AT 0315 ON [id number] BY FORSYTH K   Wound culture     Status: None   Collection Time: 08/28/15 10:00 AM  Result Value Ref Range Status   Specimen Description WOUND DRAINAGE PEG TUBE SITE  Final   Special Requests NONE  Final   Gram Stain   Final    NO WBC SEEN NO SQUAMOUS EPITHELIAL CELLS SEEN ABUNDANT YEAST Performed at [facility]    Culture   Final    ABUNDANT YEAST Performed at [facility]    Report Status 08/31/2015 FINAL  Final     Studies: Dg Abd Portable 1v  08/31/2015  CLINICAL DATA:  Percutaneous endoscopic gastrostomy tube placement. EXAM: PORTABLE ABDOMEN - 1 VIEW COMPARISON:  Abdominal CT 08/26/2015 FINDINGS: Initial portable AP supine view the abdomen demonstrated gastrostomy tube projecting over the upper abdomen. No enteric contrast within administered. Subsequently, multiple supine spot images of the abdomen were obtained after  administration of Gastrografin, cannot not documented. Enteric contrast opacifies the stomach, duodenum, and small bowel. Minimal contamination involving gauze on the skin, however no evidence of extravasation or leak. Opacification of right hemithorax partially included. IMPRESSION: Gastrostomy tube in the stomach without evidence of extravasation or leak. Electronically Signed   By: Rubye Oaks M.D.   On: 08/31/2015 21:58    Scheduled Meds: . antiseptic oral rinse  7 mL Mouth Rinse q12n4p  . chlorhexidine  15 mL Mouth Rinse BID  . insulin aspart  0-9 Units Subcutaneous 6 times per day  . pantoprazole (PROTONIX) IV  40 mg Intravenous Q12H  . polyethylene glycol  17 g Oral BID   Continuous Infusions: . feeding supplement (VITAL AF 1.2 CAL) 1,000 mL (09/01/15 1031)  . sodium chloride 0.45 % with kcl 50 mL/hr at 09/01/15 1045    Active Problems:   GI bleed   Sepsis (HCC)   Dementia   Anemia   UGIB (upper gastrointestinal bleed)   Pneumothorax, right   Hyperkalemia   Acute renal failure (HCC)   Acute blood loss anemia   Pressure ulcer   Malnutrition of moderate degree   Time spent: 25 minutes  Jehanzeb Memon. MD [facility] Pager [phone]. If 7PM-7AM, please contact night-coverage at [email or web address], password [email or web address] 09/01/2015, 12:11 PM  LOS: 6 days

## 2015-09-01 NOTE — Progress Notes (Signed)
Spoke with Vara Guardian at the law firm overseeing patient's estate- she has provided me with contact info for [employer] Guardian (Mr Loper's) supervisor- [phone] Mordecai Maes- I have left a message for her as well requesting a callback to CSW and/or MD.   Reece Levy, MSW, Theresia Majors  [phone]

## 2015-09-01 NOTE — Progress Notes (Signed)
Subjective: Patient is more alert today and asking where his. He denies any difficulty breathing.  Objective: Vital signs in last 24 hours: Temp:  [97.9 F (36.6 C)-99.9 F (37.7 C)] 98.5 F (36.9 C) (01/30 0629) Pulse Rate:  [82-93] 82 (01/30 0629) Resp:  [20] 20 (01/30 0629) BP: (137-172)/(67-91) 137/67 mmHg (01/30 0629) SpO2:  [93 %-98 %] 95 % (01/30 0629) Weight:  [117 lb 11.6 oz (53.4 kg)-118 lb 6.2 oz (53.7 kg)] 117 lb 11.6 oz (53.4 kg) (01/30 0629)  Intake/Output from previous day: 01/29 0701 - 01/30 0700 In: 1150 [I.V.:1100; IV Piggyback:50] Out: 2700 [Urine:1000; Drains:1700] Intake/Output this shift:     Recent Labs  08/30/15 0636 08/31/15 1201 09/01/15 0652  HGB 9.2* 8.9* 8.0*    Recent Labs  08/31/15 1201 09/01/15 0652  WBC 14.2* 12.3*  RBC 3.04* 2.68*  HCT 27.3* 24.4*  PLT 227 189    Recent Labs  08/31/15 1201 09/01/15 0652  NA 143 141  K 4.7 3.8  CL 107 104  CO2 22 24  BUN 89* 86*  CREATININE 2.77* 2.78*  GLUCOSE 132* 167*  CALCIUM 8.0* 7.9*   No results for input(s): LABPT, INR in the last 72 hours. Generally patient seems to be more alert and in no apparent distress Chest: Decreased breath some bilaterally, no wheezing Heart exam regular rate and rhythm Abdomen: Patient seems to have some tenderness, no distended and positive bowel sound. Patient seems to have some leak around his gastric feeding tube Extremities: No edema patient with multiple pressure ulcers on his toes  Assessment/Plan: Problem #1 acute kidney injury: Possibly secondary to sepsis/prerenal syndrome . Presently his renal function seems to be stable. Patient had 2700 mL of urine output with the last 24 hours. Problem #2 hypokalemia: Patient has been put on replacement. His potassium has normalized Problem #3 hypertension: His blood pressure is better today. Problem #4 anemia: Secondary to GI bleeding. His hemoglobin is has improved . Problem #5 dementia Problem #6  history of sepsis: Possibly UTI/pressure soar. Presently he is on antibiotics. He shouldn't is a febrile but his white blood cell count is also improving. Problem #7 right large hydropneumothorax Problem #8 hypenatremia: His sodium has corrected. Plan: 1] will DC Lasix 2] will check his basic metabolic panel 3] will decrease his IV fluid to 50 mL per hour 4] will increase his feeding to 60 mL per hour.   Karynn Deblasi S 09/01/2015, 8:10 AM

## 2015-09-02 DIAGNOSIS — Z66 Do not resuscitate: Secondary | ICD-10-CM

## 2015-09-02 LAB — GLUCOSE, CAPILLARY
GLUCOSE-CAPILLARY: 154 mg/dL — AB (ref 65–99)
GLUCOSE-CAPILLARY: 171 mg/dL — AB (ref 65–99)
GLUCOSE-CAPILLARY: 208 mg/dL — AB (ref 65–99)
Glucose-Capillary: 142 mg/dL — ABNORMAL HIGH (ref 65–99)
Glucose-Capillary: 156 mg/dL — ABNORMAL HIGH (ref 65–99)
Glucose-Capillary: 258 mg/dL — ABNORMAL HIGH (ref 65–99)

## 2015-09-02 LAB — BASIC METABOLIC PANEL
Anion gap: 14 (ref 5–15)
BUN: 98 mg/dL — ABNORMAL HIGH (ref 6–20)
CHLORIDE: 105 mmol/L (ref 101–111)
CO2: 25 mmol/L (ref 22–32)
Calcium: 8.1 mg/dL — ABNORMAL LOW (ref 8.9–10.3)
Creatinine, Ser: 2.73 mg/dL — ABNORMAL HIGH (ref 0.61–1.24)
GFR calc non Af Amer: 19 mL/min — ABNORMAL LOW (ref >60)
GFR, EST AFRICAN AMERICAN: 22 mL/min — AB (ref >60)
Glucose, Bld: 161 mg/dL — ABNORMAL HIGH (ref 65–99)
POTASSIUM: 3.8 mmol/L (ref 3.5–5.1)
SODIUM: 144 mmol/L (ref 135–145)

## 2015-09-02 LAB — CBC
HEMATOCRIT: 27.1 % — AB (ref 39.0–52.0)
Hemoglobin: 8.9 g/dL — ABNORMAL LOW (ref 13.0–17.0)
MCH: 30 pg (ref 26.0–34.0)
MCHC: 32.8 g/dL (ref 30.0–36.0)
MCV: 91.2 fL (ref 78.0–100.0)
PLATELETS: 178 10*3/uL (ref 150–400)
RBC: 2.97 MIL/uL — AB (ref 4.22–5.81)
RDW: 20.4 % — ABNORMAL HIGH (ref 11.5–15.5)
WBC: 14.3 10*3/uL — AB (ref 4.0–10.5)

## 2015-09-02 MED ORDER — LORAZEPAM 1 MG PO TABS: 1.0000 mg | ORAL_TABLET | Freq: Two times a day (BID) | ORAL | Status: AC | PRN

## 2015-09-02 MED ORDER — PANTOPRAZOLE SODIUM 40 MG PO PACK: 40.0000 mg | PACK | Freq: Every day | ORAL | Status: AC

## 2015-09-02 MED ORDER — POLYETHYLENE GLYCOL 3350 17 G PO PACK: 17.0000 g | PACK | Freq: Two times a day (BID) | ORAL | Status: AC

## 2015-09-02 NOTE — Discharge Summary (Addendum)
Physician Discharge Summary  Dylan Walsh WUJ:[id number] DOB: [birth year] DOA: 08/26/2015  PCP: No primary care provider on file.  Admit date: 08/26/2015 Discharge date: 09/03/2015  Time spent: 40 minutes  Recommendations for Outpatient Follow-up:  1. Follow up with nephrology in 4 weeks 2. Repeat chest xray in 1 month 3. Follow up with GI as needed 4. Repeat cbc and bmet in 1 week   Discharge Diagnoses:  Active Problems:   GI bleed   Dementia   Anemia due to acute blood loss   UGIB (upper gastrointestinal bleed)   Pneumothorax, right   Hyperkalemia   Acute renal failure (HCC)   Acute blood loss anemia   Stage 2 pressure ulcers on sacrum, left elbow, right elbow, all present on admission   Malnutrition of moderate degree   Bed Bound   DO NOT RESUSCITATE, confirmed with his legal guardian at DSS   Gastrostomy tube dependent   Suprapubic catheter   Discharge Condition: Stable  Diet recommendation: Tube feeding, Glucerna 1.2 at 50 mL per hour  Filed Weights   09/01/15 0512 09/01/15 0629 09/02/15 0536  Weight: 53.7 kg (118 lb 6.2 oz) 53.4 kg (117 lb 11.6 oz) 53.978 kg (119 lb)    History of present illness:  82 year old male with multiple medical problems including gastrostomy tube for dysphagia, advanced dementia, bedbound, presented to the emergency room with hypotension. He was noted to have feculent material that was blood tinged coming from his G-tube. He was evaluated in the emergency room found her hemoglobin is 4.5. He was admitted for further treatments.  Hospital Course:  1. Upper GI bleed with guaiac positive stool coming out of PEG tube. Patient was seen by GI. He was started on Protonix twice a day with resolution of blood from PEG tube. His hemoglobin has remained stable. Would avoid invasive diagnostic testing considering patient's age and co morbidities. Hemoglobin is stable at this time.  2. ABLA, Hgb 4.5 on admission. Baseline Hgb is unclear. He was  transfused 2 units PRBCs on admission and his hemoglobin has since been stable. He will need a repeat CBC in 1 week. 3. Bacteremia. Blood cultures show 1/2 cultures positive for coagulase negative staph which is likely a contaminant. Lactic acid trending down, WBC continues to also trend down. He has been afebrile 4. Large right hydropneumothorax, revealed on abdominal CT and chest xray. Patient did not appear to be in any respiratory distress at this time. In fact he appeared to be breathing comfortably on room air. Due to his advanced age and multiple medical problems, he is not a good candidate for surgical intervention, including chest tube. Discussed with Dr. Tyron Russell, Radiology who felt that draining pleural effusion would worsen the pneumothorax and advised against it. Discussed with Dr. Juanetta Gosling, Pulmonologist who has been following patient, who agreed and recommended continued observation with repeat chest xray in 1 month.  5. Hyperkalemia, resolved with kayexalate.  6. Acute renal failure, likely superimposed on some degree of CKD, clinically undetermined. He had evidence of anasarca on CT imaging and also has atrophic kidneys. Nephrology input appreciated. Creatinine although still abnormal will remain stable. It is felt appropriate to discharge the patient have follow-up with nephrology as an outpatient. 7. Dementia, appears to be at baseline. Patient is bed bound and dependent on feeding from PEG tube. He also has a suprapubic catheter. 8. Hypotension, patient initially was hypotensive on admission which has resolved. Initially there was a concern for sepsis and he was started on broad spectrum  abx and IV hydration. Hypotension more likely related to severe anemia and dehydration. Lactic acid trending down with IVF. Blood pressure now stable  Procedures: Transfused 2U PRBCs 1/24  Consultations: 9. GI 10. Pulmonology 11. Nephrology  Discharge Exam: Filed Vitals:   09/02/15 0617 09/02/15  1356  BP: 160/93 140/69  Pulse: 91 93  Temp:  97.4 F (36.3 C)  Resp:  20    General: No acute distress Cardiovascular: S1, S2, regular rate and rhythm Respiratory: Absent breath sounds on right side, rhonchi on left side  Discharge Instructions    Current Discharge Medication List    START taking these medications   Details  pantoprazole sodium (PROTONIX) 40 mg/20 mL PACK Place 20 mLs (40 mg total) into feeding tube daily. Qty: 30 each, Refills: 0    polyethylene glycol (MIRALAX / GLYCOLAX) packet Take 17 g by mouth 2 (two) times daily. Qty: 14 each, Refills: 0      CONTINUE these medications which have CHANGED   Details  LORazepam (ATIVAN) 1 MG tablet Take 1 tablet (1 mg total) by mouth 2 (two) times daily as needed for anxiety. For agitation Qty: 30 tablet, Refills: 0      CONTINUE these medications which have NOT CHANGED   Details  collagenase (SANTYL) ointment Apply 1 application topically daily.    morphine 10 MG/5ML solution Take 5 mg by mouth every 4 (four) hours as needed for moderate pain or severe pain.    Nutritional Supplements (FEEDING SUPPLEMENT, GLUCERNA 1.2 CAL,) LIQD Place 50 mLs into feeding tube continuous. 11ml/hr every shift for supplement    OXYGEN Inhale 2 L into the lungs continuous.    scopolamine (TRANSDERM-SCOP, 1.5 MG,) 1 MG/3DAYS Place 1 patch onto the skin See admin instructions. Apply every 72 hours as needed for for secretions       No Known Allergies    The results of significant diagnostics from this hospitalization (including imaging, microbiology, ancillary and laboratory) are listed below for reference.    Significant Diagnostic Studies: Ct Abdomen Pelvis Wo Contrast  08/26/2015  CLINICAL DATA:  Severe lactic acidosis. Hypotension. Elevated creatinine. Initial encounter. EXAM: CT ABDOMEN AND PELVIS WITHOUT CONTRAST TECHNIQUE: Multidetector CT imaging of the abdomen and pelvis was performed following the standard protocol  without IV contrast. COMPARISON:  Chest in two views abdomen earlier today. FINDINGS: The study is somewhat limited due to difficulty positioning the patient. The patient has a large right pleural effusion and right pneumothorax. No left effusion is identified. Heart size is enlarged. There is diffuse body wall and mesenteric edema. A gastrostomy tube is in place. No free intraperitoneal air, portal venous gas or pneumatosis is identified. There is a large volume of stool in the colon. The stomach and small bowel are unremarkable. The patient is status post cholecystectomy. The liver, spleen, adrenal glands and biliary tree are unremarkable. The kidneys appear atrophic bilaterally. Low attenuating lesions in the left kidney are likely cysts. Linear high attenuation focus in the right kidney could be a nonobstructing stone or vascular calcification. There is marked degenerative disease about the hips and multilevel lumbar spondylosis. No lytic or sclerotic lesion is identified. IMPRESSION: Large right hydropneumothorax. No CT evidence of bowel ischemia is identified. Anasarca. Extensive atherosclerosis. Critical Value/emergent results were called by telephone at the time of interpretation on 08/26/2015 at 11:14 pm to Dr. Crista Curb , who verbally acknowledged these results. Electronically Signed   By: Drusilla Kanner M.D.   On: 08/26/2015 23:14  Dg Abd Acute W/chest  08/26/2015  CLINICAL DATA:  Hypotensive, blood and feces around feeding tube for several days, contracted, hypertension, coronary artery disease, dementia EXAM: DG ABDOMEN ACUTE W/ 1V CHEST COMPARISON:  None. FINDINGS: Enlargement of cardiac silhouette. Elongation of thoracic aorta. LEFT lung clear. Opacification of inferior RIGHT hemi thorax by large probable RIGHT pleural effusion. Significant atelectasis of RIGHT lung. Areas of lucency in the RIGHT hemi thorax may represent loculated pneumothorax or large bullae. Gastrostomy tube mid abdomen. Gas  collection under LEFT hemidiaphragm likely represents combination of gas within the stomach and gaseous distention of the distal transverse colon and splenic flexure. No free intraperitoneal air. Paucity of small bowel gas. Diffuse osseous demineralization with degenerative changes of the lumbar spine. Degenerative changes of both hip joints. Scattered vascular calcifications. Questionable nonobstructing RIGHT renal calculus 6 mm diameter. Surgical clips RIGHT upper quadrant question cholecystectomy. IMPRESSION: No definite evidence of bowel obstruction or perforation. Nonspecific gaseous distention of the distal transverse colon and splenic flexure. Large RIGHT pleural effusion with loculated gas collections in the RIGHT hemi thorax which may represent loculated pneumothoraces or less likely bullae; no evidence of mediastinal shift to suggest tension. RIGHT chest could be better evaluated by CT. Question nonobstructing RIGHT renal calculus. Question prior cholecystectomy. Critical Value/emergent results were called by telephone at the time of interpretation on 08/26/2015 at 7:04 pm to Dr. Crista Curb , who verbally acknowledged these results. Electronically Signed   By: Ulyses Southward M.D.   On: 08/26/2015 19:05   Dg Abd Portable 1v  08/31/2015  CLINICAL DATA:  Percutaneous endoscopic gastrostomy tube placement. EXAM: PORTABLE ABDOMEN - 1 VIEW COMPARISON:  Abdominal CT 08/26/2015 FINDINGS: Initial portable AP supine view the abdomen demonstrated gastrostomy tube projecting over the upper abdomen. No enteric contrast within administered. Subsequently, multiple supine spot images of the abdomen were obtained after administration of Gastrografin, cannot not documented. Enteric contrast opacifies the stomach, duodenum, and small bowel. Minimal contamination involving gauze on the skin, however no evidence of extravasation or leak. Opacification of right hemithorax partially included. IMPRESSION: Gastrostomy tube in the  stomach without evidence of extravasation or leak. Electronically Signed   By: Rubye Oaks M.D.   On: 08/31/2015 21:58    Microbiology: Recent Results (from the past 240 hour(s))  Urine culture     Status: None   Collection Time: 08/26/15  8:00 PM  Result Value Ref Range Status   Specimen Description URINE, CLEAN CATCH  Final   Special Requests NONE  Final   Culture   Final    >=100,000 COLONIES/mL YEAST Performed at [facility]    Report Status 08/28/2015 FINAL  Final  Blood culture (routine x 2)     Status: None   Collection Time: 08/26/15  8:20 PM  Result Value Ref Range Status   Specimen Description BLOOD LEFT HAND  Final   Special Requests BOTTLES DRAWN AEROBIC ONLY 6CC  Final   Culture  Setup Time   Final    GRAM POSITIVE COCCI IN CLUSTERS Gram Stain Report Called to,Read Back By and Verified With: HANDY, T. AT 2243 ON 08/29/2015 BY AGUNDIZ, E.    Culture   Final    STAPHYLOCOCCUS SPECIES (COAGULASE NEGATIVE) THE SIGNIFICANCE OF ISOLATING THIS ORGANISM FROM A SINGLE SET OF BLOOD CULTURES WHEN MULTIPLE SETS ARE DRAWN IS UNCERTAIN. PLEASE NOTIFY THE MICROBIOLOGY DEPARTMENT WITHIN ONE WEEK IF SPECIATION AND SENSITIVITIES ARE REQUIRED. Performed at [facility]    Report Status 08/31/2015 FINAL  Final  Blood culture (routine x 2)     Status: None   Collection Time: 08/26/15  8:35 PM  Result Value Ref Range Status   Specimen Description BLOOD LEFT HAND  Final   Special Requests BOTTLES DRAWN AEROBIC ONLY 6CC  Final   Culture NO GROWTH 6 DAYS  Final   Report Status 09/01/2015 FINAL  Final  MRSA PCR Screening     Status: Abnormal   Collection Time: 08/26/15 11:30 PM  Result Value Ref Range Status   MRSA by PCR POSITIVE (A) NEGATIVE Final    Comment:        The GeneXpert MRSA Assay (FDA approved for NASAL specimens only), is one component of a comprehensive MRSA colonization surveillance program. It is not intended to diagnose MRSA infection nor to  guide or monitor treatment for MRSA infections. RESULT CALLED TO, READ BACK BY AND VERIFIED WITH: HAMMOCK S AT 0315 ON [id number] BY FORSYTH K   Wound culture     Status: None   Collection Time: 08/28/15 10:00 AM  Result Value Ref Range Status   Specimen Description WOUND DRAINAGE PEG TUBE SITE  Final   Special Requests NONE  Final   Gram Stain   Final    NO WBC SEEN NO SQUAMOUS EPITHELIAL CELLS SEEN ABUNDANT YEAST Performed at [facility]    Culture   Final    ABUNDANT YEAST Performed at [facility]    Report Status 08/31/2015 FINAL  Final     Labs: Basic Metabolic Panel:  Recent Labs Lab 08/29/15 0702 08/30/15 0636 08/31/15 1201 09/01/15 0652 09/02/15 0904  NA 144 147* 143 141 144  K 3.6 3.1* 4.7 3.8 3.8  CL 107 110 107 104 105  CO2 21* GLUCOSE 165* 140* 132* 167* 161*  BUN 94* 92* 89* 86* 98*  CREATININE 3.09* 3.00* 2.77* 2.78* 2.73*  CALCIUM 8.1* 7.9* 8.0* 7.9* 8.1*   Liver Function Tests:  Recent Labs Lab 08/26/15 1950 08/27/15 0444  AST 43* 199*  ALT 23 104*  ALKPHOS 60 65  BILITOT 0.7 2.1*  PROT 5.5* 5.5*  ALBUMIN 2.6* 2.7*    Recent Labs Lab 08/26/15 1950  LIPASE 17   No results for input(s): AMMONIA in the last 168 hours. CBC:  Recent Labs Lab 08/26/15 1950  08/29/15 0702 08/30/15 0636 08/31/15 1201 09/01/15 0652 09/02/15 0904  WBC 12.9*  < > 15.1* 14.6* 14.2* 12.3* 14.3*  NEUTROABS 10.7*  --   --   --   --   --   --   HGB 4.5*  < > 8.9* 9.2* 8.9* 8.0* 8.9*  HCT 14.0*  < > 26.8* 27.8* 27.3* 24.4* 27.1*  MCV 83.8  < > 88.2 89.4 89.8 91.0 91.2  PLT 260  < > 218 235 227 189 178  < > = values in this interval not displayed. Cardiac Enzymes: No results for input(s): CKTOTAL, CKMB, CKMBINDEX, TROPONINI in the last 168 hours. BNP: BNP (last 3 results)  Recent Labs  08/26/15 1950  BNP 2548.0*    ProBNP (last 3 results) No results for input(s): PROBNP in the last 8760 hours.  CBG:  Recent  Labs Lab 09/02/15 0020 09/02/15 0436 09/02/15 0851 09/02/15 1137 09/02/15 1702  GLUCAP 258* 156* 142* 154* 171*       Signed:  MEMON,JEHANZEB MD.  [facility] 09/02/2015, 7:10 PM

## 2015-09-02 NOTE — Progress Notes (Signed)
Subjective: Patient offers no complaints.. He denies any difficulty breathing.  Objective: Vital signs in last 24 hours: Temp:  [97.5 F (36.4 C)-98.7 F (37.1 C)] 97.5 F (36.4 C) (01/31 0536) Pulse Rate:  [84-97] 91 (01/31 0617) Resp:  [20] 20 (01/31 0536) BP: (134-188)/(64-95) 160/93 mmHg (01/31 0617) SpO2:  [92 %-97 %] 92 % (01/31 0536) Weight:  [119 lb (53.978 kg)] 119 lb (53.978 kg) (01/31 0536)  Intake/Output from previous day: 01/30 0701 - 01/31 0700 In: 1320 [I.V.:600; NG/GT:720] Out: 1310 [Urine:1310] Intake/Output this shift:     Recent Labs  08/31/15 1201 09/01/15 0652  HGB 8.9* 8.0*    Recent Labs  08/31/15 1201 09/01/15 0652  WBC 14.2* 12.3*  RBC 3.04* 2.68*  HCT 27.3* 24.4*  PLT 227 189    Recent Labs  08/31/15 1201 09/01/15 0652  NA 143 141  K 4.7 3.8  CL 107 104  CO2 22 24  BUN 89* 86*  CREATININE 2.77* 2.78*  GLUCOSE 132* 167*  CALCIUM 8.0* 7.9*   No results for input(s): LABPT, INR in the last 72 hours. Generally patient seems to be more alert and in no apparent distress Chest: Decreased breath some bilaterally, no wheezing Heart exam regular rate and rhythm Abdomen: Patient seems to have some tenderness, no distended and positive bowel sound. Patient seems to have some leak around his gastric feeding tube Extremities: No edema patient with multiple pressure ulcers on his toes  Assessment/Plan: Problem #1 acute kidney injury: Possibly secondary to sepsis/prerenal syndrome . Presently his renal function seems to be stable. Patient is presently nonoliguric. He had 1300 mL of urine output the last 24 hours. Problem #2 hypokalemia: Patient has been put on replacement. His potassium has normalized Problem #3 hypertension: His blood pressure is better today. Problem #4 anemia: Secondary to GI bleeding. His hemoglobin is low but stable Problem #5 dementia Problem #6 history of sepsis: Possibly UTI/pressure soar. Presently he is on  antibiotics. H Problem #7 right large hydropneumothorax Problem #8 hypenatremia: His sodium has corrected. Plan: 1] will continue with present treatment. 2] will check his basic metabolic panel in the morning.   Candice Lunney S 09/02/2015, 8:01 AM

## 2015-09-02 NOTE — Progress Notes (Signed)
Discussed with Dr. Kerry Hough. Agree with thoracentesis may make his pneumothorax worse and since he is asymptomatic from his pleural effusion it is unlikely to provide any clinical benefit. Agree that he should have no CODE BLUE status.

## 2015-09-02 NOTE — Clinical Social Work Note (Addendum)
CSW spoke with Donnella Sham, [facility] DSS, to follow up on paperwork related to patient's code status being changed to DNR.  Mr. Joya Gaskins advised that his supervisor has the paperwork. He stated that he would have his supervisor contact CSW regarding this issue.  CSW spoke with Jinny Sanders, supervisor, who advised that she has spoken with the Medical Director at DSS who advised her to speak with patient's family.  She advised that she had reached out to the family.  She stated that she would contact CSW by the end of the business day if she had not heard from family.   Annice Needy, [place] [phone]

## 2015-09-02 NOTE — Progress Notes (Signed)
Text paged Dr.David. Pt BP 188/95 with a HR of 97. No new orders at this time. Will continue to monitor

## 2015-09-02 NOTE — Progress Notes (Signed)
Nutrition Follow-up  DOCUMENTATION CODES:  Underweight, Non-severe (moderate) malnutrition in context of chronic illness  INTERVENTION:  Continue Vital af 1.2 @ 60 via PEG   Tube feeding regimen provides 1728 kcal (99% of needs), 108 grams of protein, and 1168 ml of H2O.   If not receiving IVF, add 150 ml flush q 4 hrs to provide additional 900 ml fluid  NUTRITION DIAGNOSIS:  Increased nutrient needs related to wound healing, acute illness as evidenced by estimated nutrition requirements for the conditions  Ongoing  GOAL:  Patient will meet greater than or equal to 90% of their needs  MONITOR:  Diet advancement, Labs, Weight trends, TF tolerance, I & O's, Skin  ASSESSMENT:  82 y/o male PMHx HTN, CAD, Dementia, GERD, PEG, dysphagia who presents from snf for hypotension and g-tube output w/ feculent material and blood-tinged discharge. Pt found to be septic, anemic, stool guaiac positive w/ pneumothorax/pleural effusion and in ARF on CKD. Pt is ward of state, but with poor prognosis    Interval hx per 1/27: No blood per rectum. Seems to be tolerating TF Interval hx per 1/31: Pt had TF decreased to 40 cc/hr x 2 days due to reported abd pain. No blood in stool or from PEG  On RD arrival, tf infusing at goal, pt more somnolent. Abdomen was soft/non distended   Labs reviewed:had had higher cbgs on 1/30  >200 mg/dl. WBC up since yesterday, Bun increased   Continue TF at current rate.   Diet Order:  Diet NPO time specified  Skin:  Abrasion sacrum, toe blister/open ulcers, PU stage 2 sacrum, PU stage 2 R elbow, PU stage 2 L elbow  Last BM:  1/26-incontinent  Height:  Ht Readings from Last 1 Encounters:  08/26/15  (1.676 m)   Weight:  Wt Readings from Last 1 Encounters:  09/02/15 119 lb (53.978 kg)   Admit weight: 114 lbs  Ideal Body Weight:  64.55 kg  BMI:  Body mass index using admit weight is 18.4 kg/(m^2).  Estimated Nutritional Needs:  Kcal:  1750-1950 kcals  (34-38 kcal/kg bw) Protein:  88-104 g (1.7-2 g/kg bw) Fluid:  2 liters  EDUCATION NEEDS:  No education needs identified at this time  Christophe Louis RD, LDN Nutrition Pager: [phone] 09/02/2015 11:55 AM

## 2015-09-03 LAB — BASIC METABOLIC PANEL
ANION GAP: 14 (ref 5–15)
BUN: 105 mg/dL — ABNORMAL HIGH (ref 6–20)
CALCIUM: 7.9 mg/dL — AB (ref 8.9–10.3)
CHLORIDE: 107 mmol/L (ref 101–111)
CO2: 24 mmol/L (ref 22–32)
Creatinine, Ser: 2.54 mg/dL — ABNORMAL HIGH (ref 0.61–1.24)
GFR calc non Af Amer: 21 mL/min — ABNORMAL LOW (ref >60)
GFR, EST AFRICAN AMERICAN: 24 mL/min — AB (ref >60)
GLUCOSE: 203 mg/dL — AB (ref 65–99)
POTASSIUM: 3.8 mmol/L (ref 3.5–5.1)
Sodium: 145 mmol/L (ref 135–145)

## 2015-09-03 LAB — GLUCOSE, CAPILLARY
GLUCOSE-CAPILLARY: 165 mg/dL — AB (ref 65–99)
GLUCOSE-CAPILLARY: 151 mg/dL — AB (ref 65–99)
Glucose-Capillary: 169 mg/dL — ABNORMAL HIGH (ref 65–99)
Glucose-Capillary: 258 mg/dL — ABNORMAL HIGH (ref 65–99)

## 2015-09-03 NOTE — [place] FL2 (Signed)
[place] MEDICAID FL2 LEVEL OF CARE SCREENING TOOL     IDENTIFICATION  Patient Name: Dylan Walsh Birthdate: 09-25-1924 Sex: male Admission Date (Current Location): 08/26/2015  [facility] and [place] Number:  [employer] and Address:  [facility],  [address] [place] [address]      Provider Number: [id number]  Attending Physician Name and Address:  Jerald Kief, MD  Relative Name and Phone Number:       Current Level of Care: Hospital Recommended Level of Care: Skilled Nursing Facility Prior Approval Number:    Date Approved/Denied:   PASRR Number:  [id number])  Discharge Plan: SNF    Current Diagnoses: Patient Active Problem List   Diagnosis Date Noted  . DNR (do not resuscitate) 09/02/2015  . Malnutrition of moderate degree 08/29/2015  . Pressure ulcer 08/28/2015  . Pneumothorax, right 08/27/2015  . Hyperkalemia 08/27/2015  . Acute renal failure (HCC) 08/27/2015  . Acute blood loss anemia 08/27/2015  . GI bleed 08/26/2015  . Dementia 08/26/2015  . Anemia 08/26/2015  . UGIB (upper gastrointestinal bleed) 08/26/2015    Orientation RESPIRATION BLADDER Height & Weight     Self  O2 (2L) Incontinent Weight: 122 lb 9.2 oz (55.6 kg) Height:   (167.6 cm)  BEHAVIORAL SYMPTOMS/MOOD NEUROLOGICAL BOWEL NUTRITION STATUS      Incontinent Feeding tube  AMBULATORY STATUS COMMUNICATION OF NEEDS Skin   Extensive Assist Verbally PU Stage and Appropriate Care, Other (Comment)                       Personal Care Assistance Level of Assistance  Bathing, Dressing, Feeding Bathing Assistance: Maximum assistance Feeding assistance: Maximum assistance Dressing Assistance: Maximum assistance     Functional Limitations Info             SPECIAL CARE FACTORS FREQUENCY                       Contractures      Additional Factors Info  Insulin Sliding Scale Code Status Info: FULL CODE     Insulin Sliding Scale Info:   (6x/day) Isolation Precautions Info:  (08/26/15 MRSA PCR Screen = Positive )     Current Medications (09/03/2015):  This is the current hospital active medication list Current Facility-Administered Medications  Medication Dose Route Frequency Provider Last Rate Last Dose  . antiseptic oral rinse (CPC / CETYLPYRIDINIUM CHLORIDE 0.05%) solution 7 mL  7 mL Mouth Rinse q12n4p Meredeth Ide, MD   7 mL at 09/03/15 1200  . chlorhexidine (PERIDEX) 0.12 % solution 15 mL  15 mL Mouth Rinse BID Meredeth Ide, MD   15 mL at 09/03/15 0918  . feeding supplement (VITAL AF 1.2 CAL) liquid 1,000 mL  1,000 mL Per Tube Continuous Salomon Mast, MD 60 mL/hr at 09/02/15 0548 1,000 mL at 09/02/15 0548  . insulin aspart (novoLOG) injection 0-9 Units  0-9 Units Subcutaneous 6 times per day Erick Blinks, MD   2 Units at 09/03/15 1207  . ondansetron (ZOFRAN) tablet 4 mg  4 mg Oral Q6H PRN Meredeth Ide, MD       Or  . ondansetron (ZOFRAN) injection 4 mg  4 mg Intravenous Q6H PRN Meredeth Ide, MD      . oxyCODONE (Oxy IR/ROXICODONE) immediate release tablet 5 mg  5 mg Per Tube Q4H PRN Erick Blinks, MD   5 mg at 09/03/15 0455  . pantoprazole (PROTONIX) injection  40 mg  40 mg Intravenous Q12H Lavera Guise, MD   40 mg at 09/03/15 4098  . polyethylene glycol (MIRALAX / GLYCOLAX) packet 17 g  17 g Oral BID Erick Blinks, MD   17 g at 09/02/15 2321  . sodium chloride 0.45 % 1,000 mL with potassium chloride 10 mEq infusion   Intravenous Continuous Salomon Mast, MD 50 mL/hr at 09/02/15 1836       Discharge Medications: Please see discharge summary for a list of discharge medications.  Relevant Imaging Results:  Relevant Lab Results:   Additional Information  (SSN [id number])  Annice Needy, LCSW

## 2015-09-03 NOTE — Progress Notes (Signed)
Dylan Walsh  MRN: [id number]  DOB/AGE: 1924-10-27 82 y.o.  Primary Care Physician:No primary care provider on file.  Admit date: 08/26/2015  Chief Complaint:  Chief Complaint  Patient presents with  . Hypotension    S-Pt presented on  08/26/2015 with  Chief Complaint  Patient presents with  . Hypotension  .    Pt laying down comfortably.  Meds . antiseptic oral rinse  7 mL Mouth Rinse q12n4p  . chlorhexidine  15 mL Mouth Rinse BID  . insulin aspart  0-9 Units Subcutaneous 6 times per day  . pantoprazole (PROTONIX) IV  40 mg Intravenous Q12H  . polyethylene glycol  17 g Oral BID      Physical Exam: Vital signs in last 24 hours: Temp:  [97.4 F (36.3 C)-98.2 F (36.8 C)] 98.2 F (36.8 C) (02/01 0554) Pulse Rate:  [88-93] 88 (02/01 0554) Resp:  [20] 20 (02/01 0554) BP: (131-150)/(63-72) 131/63 mmHg (02/01 0554) SpO2:  [94 %-95 %] 94 % (02/01 0554) Weight:  [122 lb 9.2 oz (55.6 kg)] 122 lb 9.2 oz (55.6 kg) (02/01 0554) Weight change: 3 lb 9.2 oz (1.622 kg) Last BM Date: 09/02/15  Intake/Output from previous day: 01/31 0701 - 02/01 0700 In: 1320 [I.V.:600; NG/GT:720] Out: 750 [Urine:750]     Physical Exam: General- pt is confused. Resp- No acute REsp distress, decreased bs much more on right than left CVS- S1S2 regular in rate and rhythm GIT- BS+, soft, NT, PEG tube in situ EXT- NO LE Edema, NO Cyanosis GU- Supra pubic in situ  Lab Results: CBC  Recent Labs  09/01/15 0652 09/02/15 0904  WBC 12.3* 14.3*  HGB 8.0* 8.9*  HCT 24.4* 27.1*  PLT 189 178    BMET  Recent Labs  09/02/15 0904 09/03/15 0630  NA 144 145  K 3.8 3.8  CL 105 107  CO2 25 24  GLUCOSE 161* 203*  BUN 98* 105*  CREATININE 2.73* 2.54*  CALCIUM 8.1* 7.9*    Trend Creatinine 2017 3.0=>2.73=>2.54  MICRO Recent Results (from the past 240 hour(s))  Urine culture     Status: None   Collection Time: 08/26/15  8:00 PM  Result Value Ref Range Status   Specimen Description  URINE, CLEAN CATCH  Final   Special Requests NONE  Final   Culture   Final    >=100,000 COLONIES/mL YEAST Performed at [facility]    Report Status 08/28/2015 FINAL  Final  Blood culture (routine x 2)     Status: None   Collection Time: 08/26/15  8:20 PM  Result Value Ref Range Status   Specimen Description BLOOD LEFT HAND  Final   Special Requests BOTTLES DRAWN AEROBIC ONLY 6CC  Final   Culture  Setup Time   Final    GRAM POSITIVE COCCI IN CLUSTERS Gram Stain Report Called to,Read Back By and Verified With: HANDY, T. AT 2243 ON 08/29/2015 BY AGUNDIZ, E.    Culture   Final    STAPHYLOCOCCUS SPECIES (COAGULASE NEGATIVE) THE SIGNIFICANCE OF ISOLATING THIS ORGANISM FROM A SINGLE SET OF BLOOD CULTURES WHEN MULTIPLE SETS ARE DRAWN IS UNCERTAIN. PLEASE NOTIFY THE MICROBIOLOGY DEPARTMENT WITHIN ONE WEEK IF SPECIATION AND SENSITIVITIES ARE REQUIRED. Performed at [facility]    Report Status 08/31/2015 FINAL  Final  Blood culture (routine x 2)     Status: None   Collection Time: 08/26/15  8:35 PM  Result Value Ref Range Status   Specimen Description BLOOD LEFT HAND  Final  Special Requests BOTTLES DRAWN AEROBIC ONLY 6CC  Final   Culture NO GROWTH 6 DAYS  Final   Report Status 09/01/2015 FINAL  Final  MRSA PCR Screening     Status: Abnormal   Collection Time: 08/26/15 11:30 PM  Result Value Ref Range Status   MRSA by PCR POSITIVE (A) NEGATIVE Final    Comment:        The GeneXpert MRSA Assay (FDA approved for NASAL specimens only), is one component of a comprehensive MRSA colonization surveillance program. It is not intended to diagnose MRSA infection nor to guide or monitor treatment for MRSA infections. RESULT CALLED TO, READ BACK BY AND VERIFIED WITH: HAMMOCK S AT 0315 ON [id number] BY FORSYTH K   Wound culture     Status: None   Collection Time: 08/28/15 10:00 AM  Result Value Ref Range Status   Specimen Description WOUND DRAINAGE PEG TUBE SITE  Final    Special Requests NONE  Final   Gram Stain   Final    NO WBC SEEN NO SQUAMOUS EPITHELIAL CELLS SEEN ABUNDANT YEAST Performed at [facility]    Culture   Final    ABUNDANT YEAST Performed at [facility]    Report Status 08/31/2015 FINAL  Final      Lab Results  Component Value Date   CALCIUM 7.9* 09/03/2015               Impression: 1)Renal AKI secondary to multiple factors  Hypovolemia  Hypotension  Sepsis  AKI sec to ATN  AKI on CKD ( not sure)  CKD stage Not sure as no data before this admission is available.  Pt does have CKd as CT scan shows atrophic kidneys and pt is nearly 82 years old   ATN improving                Creat trending dwon  2)CVS-pt hypotensive at the time of admission  Now better  3)Anemia HGb now better  Pt admitted with severe anemia sec to GI bleed  Pt did receive PRBC during this admission   4)ID- pt admitted with sepsis  Pt on IV ABX   5)CNs- pt with dementia Primary MD following  6)Electrolytes Hyperkalemic  Sec to AKI + most likely CKD + Acidosis   Pt did receve kayexalate   Now better  NOrmonatremic   7)Acid base Co2 at goal     Plan:  Will continue current care.      [name] 09/03/2015, 10:36 AM

## 2015-09-03 NOTE — Progress Notes (Signed)
Patient discharging back to [facility]. Report called and given to Kristi at facility.  IV removed - WNL. PEG disconnected and flushed - patent and WNL.  Urine bag drained - suprpubic cath WNL as well. Awaiting EMS for transport.

## 2015-09-03 NOTE — Care Management Important Message (Signed)
Important Message  Patient Details  Name: Dylan Walsh MRN: [id number] Date of Birth: [birth year]   Medicare Important Message Given:  Yes    Adonis Huguenin, RN 09/03/2015, 1:59 PM

## 2015-09-03 NOTE — Clinical Social Work Note (Signed)
CSW facilitated discharge.  CSW notified Rayfield Citizen at [facility] and advised of patient's discharge.   CSW left a message for Jinny Sanders at [facility]. DSS advising that patient was being discharged and would be transported back to the facility via [facility].  CSW arranged transportation via [facility].  CSW signing off.   Annice Needy, [place] [phone]

## 2015-09-03 NOTE — Progress Notes (Addendum)
[facility] PROGRESS NOTE  Dylan Walsh ZOX:[id number] DOB: 13-Aug-1924 DOA: 08/26/2015 PCP: No primary care provider on file.   Assessment/Plan: 1. Upper GI bleed with guaiac positive stool coming out of PEG tube. Patient was seen by GI. He was started on Protonix twice a day with resolution of blood from PEG tube. His hemoglobin has remained stable. Would avoid invasive diagnostic testing considering patient's age and co morbidities. Hemoglobin is stable at this time.  2. ABLA, Hgb 4.5 on admission. Baseline Hgb is unclear. He was transfused 2 units PRBCs on admission and his hemoglobin has since been stable. He will need a repeat CBC in 1 week. 3. Bacteremia. Blood cultures show 1/2 cultures positive for coagulase negative staph which is likely a contaminant. Lactic acid trending down, WBC continues to also trend down. He has been afebrile 4. Large right hydropneumothorax, revealed on abdominal CT and chest xray. Patient did not appear to be in any respiratory distress at this time. In fact he appeared to be breathing comfortably on room air. Due to his advanced age and multiple medical problems, he is not a good candidate for surgical intervention, including chest tube. Discussed with Dr. Tyron Russell, Radiology who felt that draining pleural effusion would worsen the pneumothorax and advised against it. Discussed with Dr. Juanetta Gosling, Pulmonologist who has been following patient, who agreed and recommended continued observation with repeat chest xray in 1 month.  5. Hyperkalemia, resolved with kayexalate.  6. Acute renal failure, likely superimposed on some degree of CKD. He had evidence of anasarca on CT imaging and also has atrophic kidneys. Nephrology input appreciated. Creatinine although still abnormal will remain stable. It is felt appropriate to discharge the patient have follow-up with nephrology as an outpatient. 7. Dementia, appears to be at baseline. Patient is bed bound and dependent on  feeding from PEG tube. He also has a suprapubic catheter. 8. Hypotension secondary to hypovolemic shock, patient initially was hypotensive on admission which has resolved. Initially there was a concern for sepsis and he was started on broad spectrum abx and IV hydration. Hypotension more likely related to severe anemia and dehydration. Lactic acid trending down with IVF. Blood pressure now  Code Status: Full Family Communication: Pt in room Disposition Plan: Discharge today   Antibiotics: Anti-infectives    Start     Dose/Rate Route Frequency Ordered Stop   08/28/15 2100  vancomycin (VANCOCIN) IVPB 750 mg/150 ml premix  Status:  Discontinued     750 mg 150 mL/hr over 60 Minutes Intravenous Every 48 hours 08/27/15 1113 08/31/15 1014   08/27/15 1400  piperacillin-tazobactam (ZOSYN) IVPB 2.25 g  Status:  Discontinued     2.25 g 100 mL/hr over 30 Minutes Intravenous Every 8 hours 08/27/15 1112 08/27/15 1129   08/27/15 1400  piperacillin-tazobactam (ZOSYN) 2.25 g in dextrose 5 % 50 mL IVPB  Status:  Discontinued     2.25 g 100 mL/hr over 30 Minutes Intravenous 3 times per day 08/27/15 1129 08/31/15 1659   08/27/15 0600  piperacillin-tazobactam (ZOSYN) IVPB 3.375 g     3.375 g 12.5 mL/hr over 240 Minutes Intravenous  Once 08/27/15 0225 08/27/15 0944   08/26/15 2015  vancomycin (VANCOCIN) IVPB 1000 mg/200 mL premix     1,000 mg 200 mL/hr over 60 Minutes Intravenous  Once 08/26/15 2011 08/26/15 2247   08/26/15 2015  piperacillin-tazobactam (ZOSYN) IVPB 3.375 g     3.375 g 100 mL/hr over 30 Minutes Intravenous  Once 08/26/15 2011 08/26/15 2234  HPI/Subjective: No complaints  Objective: Filed Vitals:   09/02/15 1356 09/02/15 2020 09/02/15 2033 09/03/15 0554  BP: 140/69  150/72 131/63  Pulse: 93  93 88  Temp: 97.4 F (36.3 C)  98 F (36.7 C) 98.2 F (36.8 C)  TempSrc: Oral  Oral Oral  Resp: 20  20 20   Height:      Weight:    55.6 kg (122 lb 9.2 oz)  SpO2: 95% 94% 95% 94%     Intake/Output Summary (Last 24 hours) at 09/03/15 1232 Last data filed at 09/03/15 0245  Gross per 24 hour  Intake   1320 ml  Output    750 ml  Net    570 ml   Filed Weights   09/01/15 0629 09/02/15 0536 09/03/15 0554  Weight: 53.4 kg (117 lb 11.6 oz) 53.978 kg (119 lb) 55.6 kg (122 lb 9.2 oz)    Exam:   General:  Awake, in nad  Cardiovascular: regular, s1, s2  Respiratory: normal resp effort, no wheezing  Abdomen: soft,nondistended  Musculoskeletal: perfused,, no clubbing   Data Reviewed: Basic Metabolic Panel:  Recent Labs Lab 08/30/15 0636 08/31/15 1201 09/01/15 0652 09/02/15 0904 09/03/15 0630  NA 147* 143 141 144 145  K 3.1* 4.7 3.8 3.8 3.8  CL 110 107 104 105 107  CO2 24 22 24 25 24   GLUCOSE 140* 132* 167* 161* 203*  BUN 92* 89* 86* 98* 105*  CREATININE 3.00* 2.77* 2.78* 2.73* 2.54*  CALCIUM 7.9* 8.0* 7.9* 8.1* 7.9*   Liver Function Tests: No results for input(s): AST, ALT, ALKPHOS, BILITOT, PROT, ALBUMIN in the last 168 hours. No results for input(s): LIPASE, AMYLASE in the last 168 hours. No results for input(s): AMMONIA in the last 168 hours. CBC:  Recent Labs Lab 08/29/15 0702 08/30/15 0636 08/31/15 1201 09/01/15 0652 09/02/15 0904  WBC 15.1* 14.6* 14.2* 12.3* 14.3*  HGB 8.9* 9.2* 8.9* 8.0* 8.9*  HCT 26.8* 27.8* 27.3* 24.4* 27.1*  MCV 88.2 89.4 89.8 91.0 91.2  PLT 218 235 227 189 178   Cardiac Enzymes: No results for input(s): CKTOTAL, CKMB, CKMBINDEX, TROPONINI in the last 168 hours. BNP (last 3 results)  Recent Labs  08/26/15 1950  BNP 2548.0*    ProBNP (last 3 results) No results for input(s): PROBNP in the last 8760 hours.  CBG:  Recent Labs Lab 09/02/15 2029 09/03/15 0018 09/03/15 0528 09/03/15 0808 09/03/15 1139  GLUCAP 208* 258* 169* 165* 151*    Recent Results (from the past 240 hour(s))  Urine culture     Status: None   Collection Time: 08/26/15  8:00 PM  Result Value Ref Range Status   Specimen  Description URINE, CLEAN CATCH  Final   Special Requests NONE  Final   Culture   Final    >=100,000 COLONIES/mL YEAST Performed at [facility]    Report Status 08/28/2015 FINAL  Final  Blood culture (routine x 2)     Status: None   Collection Time: 08/26/15  8:20 PM  Result Value Ref Range Status   Specimen Description BLOOD LEFT HAND  Final   Special Requests BOTTLES DRAWN AEROBIC ONLY 6CC  Final   Culture  Setup Time   Final    GRAM POSITIVE COCCI IN CLUSTERS Gram Stain Report Called to,Read Back By and Verified With: HANDY, T. AT 2243 ON 08/29/2015 BY AGUNDIZ, E.    Culture   Final    STAPHYLOCOCCUS SPECIES (COAGULASE NEGATIVE) THE SIGNIFICANCE OF ISOLATING THIS ORGANISM FROM  A SINGLE SET OF BLOOD CULTURES WHEN MULTIPLE SETS ARE DRAWN IS UNCERTAIN. PLEASE NOTIFY THE MICROBIOLOGY DEPARTMENT WITHIN ONE WEEK IF SPECIATION AND SENSITIVITIES ARE REQUIRED. Performed at [facility]    Report Status 08/31/2015 FINAL  Final  Blood culture (routine x 2)     Status: None   Collection Time: 08/26/15  8:35 PM  Result Value Ref Range Status   Specimen Description BLOOD LEFT HAND  Final   Special Requests BOTTLES DRAWN AEROBIC ONLY 6CC  Final   Culture NO GROWTH 6 DAYS  Final   Report Status 09/01/2015 FINAL  Final  MRSA PCR Screening     Status: Abnormal   Collection Time: 08/26/15 11:30 PM  Result Value Ref Range Status   MRSA by PCR POSITIVE (A) NEGATIVE Final    Comment:        The GeneXpert MRSA Assay (FDA approved for NASAL specimens only), is one component of a comprehensive MRSA colonization surveillance program. It is not intended to diagnose MRSA infection nor to guide or monitor treatment for MRSA infections. RESULT CALLED TO, READ BACK BY AND VERIFIED WITH: HAMMOCK S AT 0315 ON [id number] BY FORSYTH K   Wound culture     Status: None   Collection Time: 08/28/15 10:00 AM  Result Value Ref Range Status   Specimen Description WOUND DRAINAGE PEG TUBE SITE   Final   Special Requests NONE  Final   Gram Stain   Final    NO WBC SEEN NO SQUAMOUS EPITHELIAL CELLS SEEN ABUNDANT YEAST Performed at [facility]    Culture   Final    ABUNDANT YEAST Performed at [facility]    Report Status 08/31/2015 FINAL  Final     Studies: No results found.  Scheduled Meds: . antiseptic oral rinse  7 mL Mouth Rinse q12n4p  . chlorhexidine  15 mL Mouth Rinse BID  . insulin aspart  0-9 Units Subcutaneous 6 times per day  . pantoprazole (PROTONIX) IV  40 mg Intravenous Q12H  . polyethylene glycol  17 g Oral BID   Continuous Infusions: . feeding supplement (VITAL AF 1.2 CAL) 1,000 mL (09/02/15 0548)  . sodium chloride 0.45 % with kcl 50 mL/hr at 09/02/15 1836    Active Problems:   GI bleed   Dementia   Anemia   UGIB (upper gastrointestinal bleed)   Pneumothorax, right   Hyperkalemia   Acute renal failure (HCC)   Acute blood loss anemia   Pressure ulcer   Malnutrition of moderate degree   DNR (do not resuscitate)    Morine Kohlman K  [facility] Pager [phone]. If 7PM-7AM, please contact night-coverage at [email or web address], password [facility] 09/03/2015, 12:32 PM  LOS: 8 days

## 2015-10-01 DEATH — deceased

## 2017-03-13 IMAGING — CT CT ABD-PELV W/O CM
1 of 2 series · 15 of 32 positions shown, 19 images · non-contrast
Comparison: Chest in two views abdomen earlier today.

CLINICAL DATA: Severe lactic acidosis. Hypotension. Elevated
creatinine. Initial encounter.

EXAM:
CT ABDOMEN AND PELVIS WITHOUT CONTRAST
TECHNIQUE: Multidetector CT imaging of the abdomen and pelvis was performed
following the standard protocol without IV contrast.

[Series 2: abdomen/pelvis w/o contrast · axial · non-contrast · 0.64mm/px · z∈[-348,+22]mm · 15 of 82 slices shown, 19 images]
[im 4/82  soft-tissue]
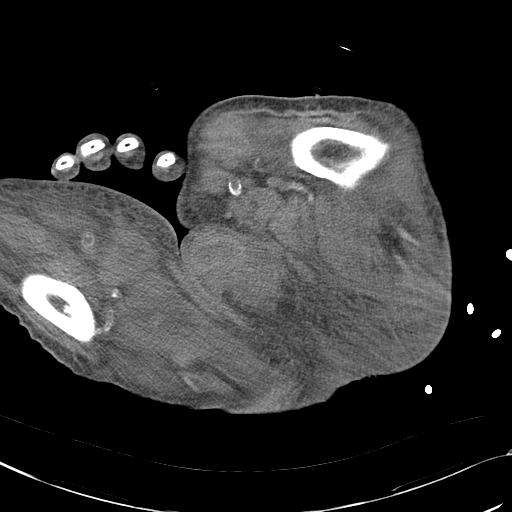
[im 4/82  bone]
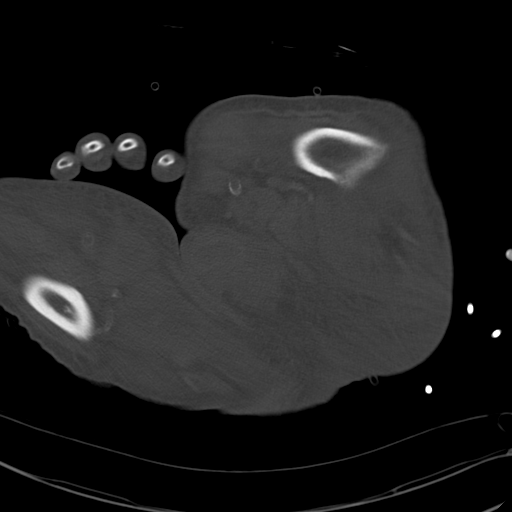
[im 12/82  soft-tissue]
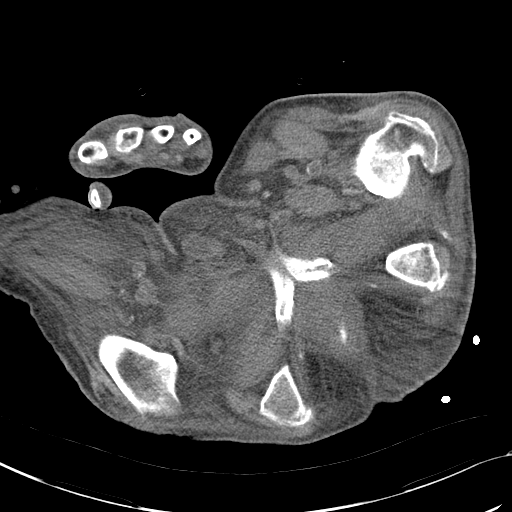
[im 19/82  soft-tissue]
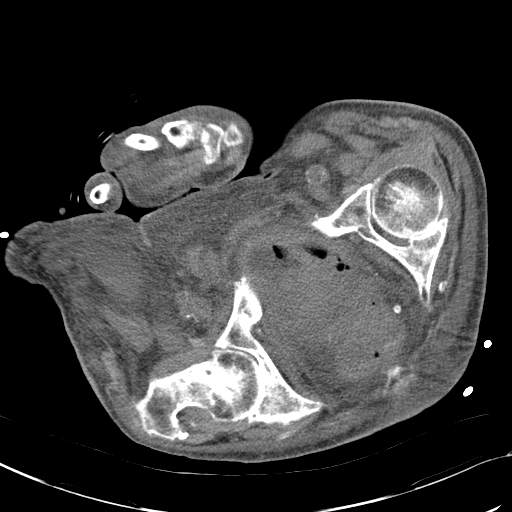
[im 23/82  soft-tissue]
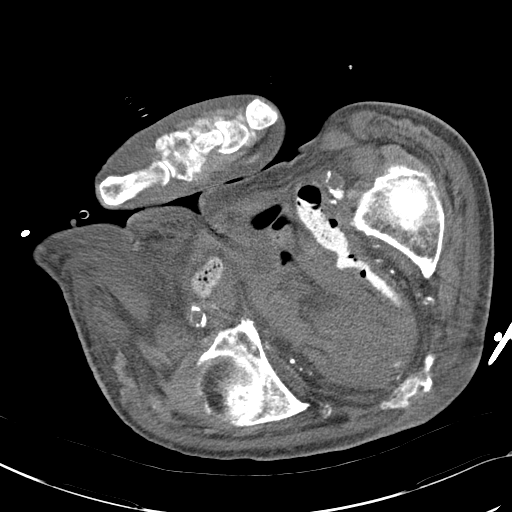
[im 30/82  soft-tissue]
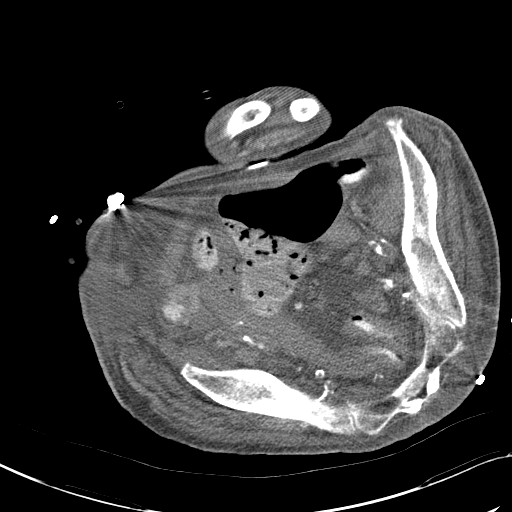
[im 34/82  soft-tissue]
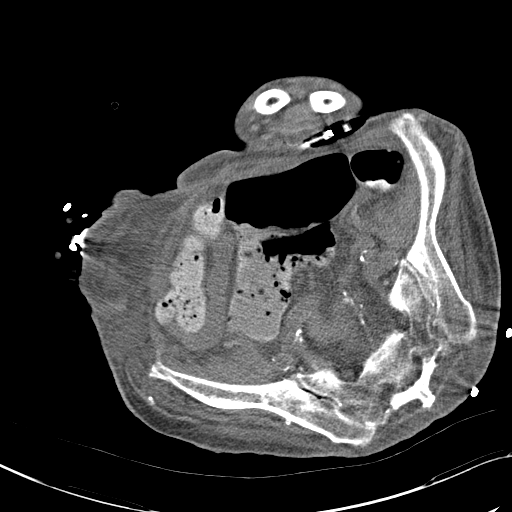
[im 41/82  soft-tissue]
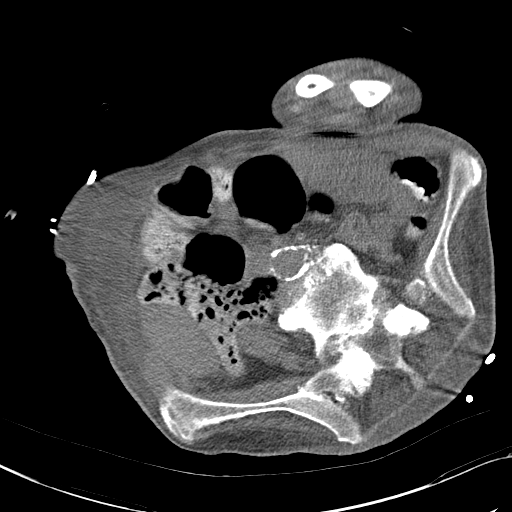
[im 48/82  soft-tissue]
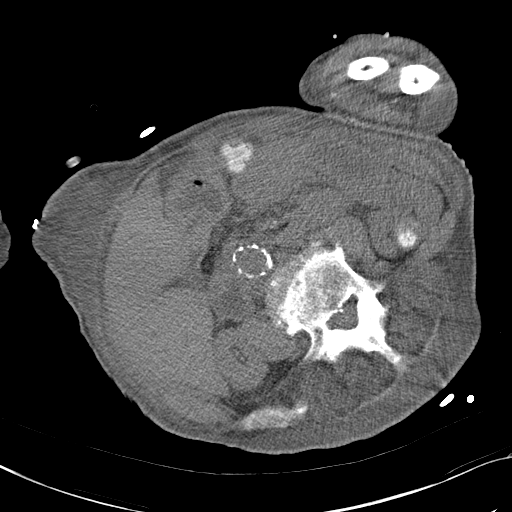
[im 52/82  soft-tissue]
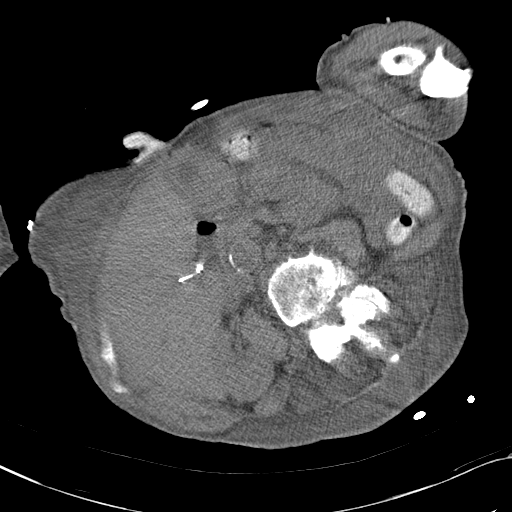
[im 52/82  bone]
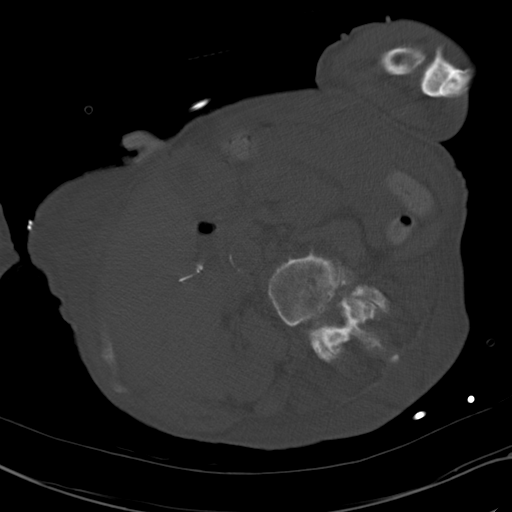
[im 59/82  soft-tissue]
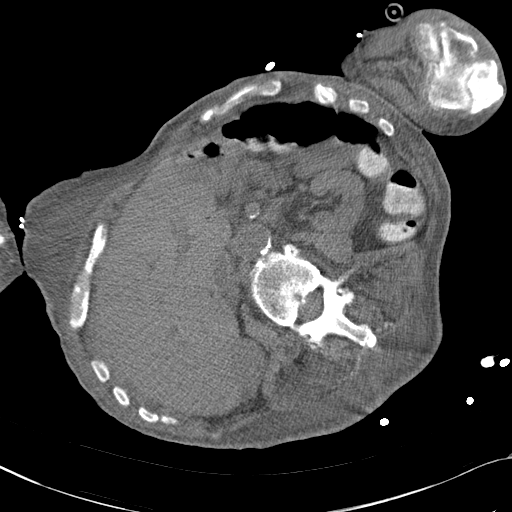
[im 63/82  soft-tissue]
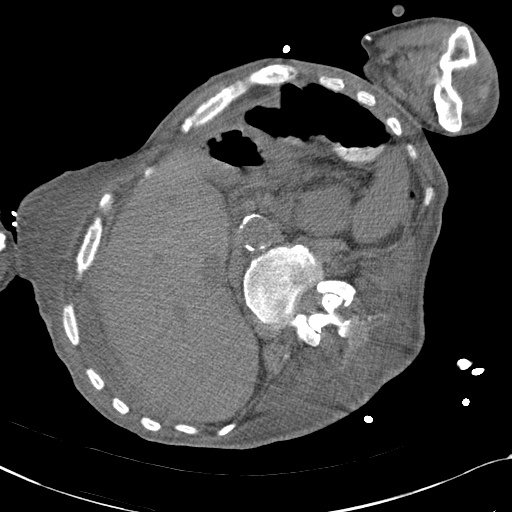
[im 67/82  lung]
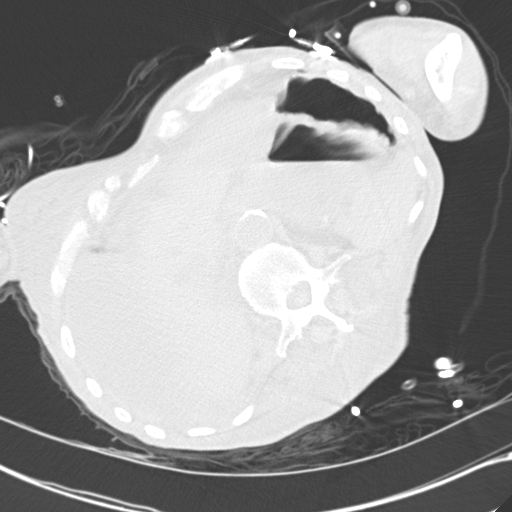
[im 70/82  soft-tissue]
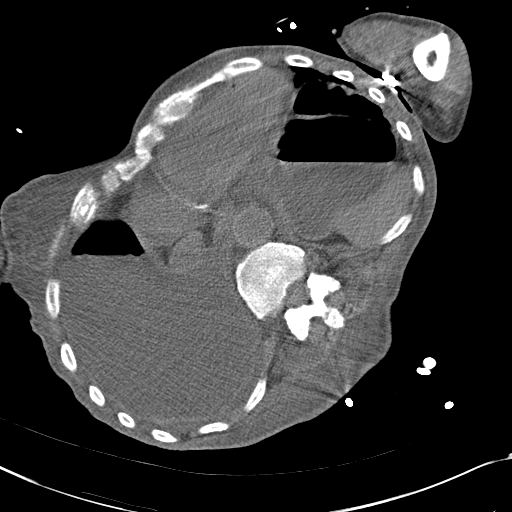
[im 70/82  lung]
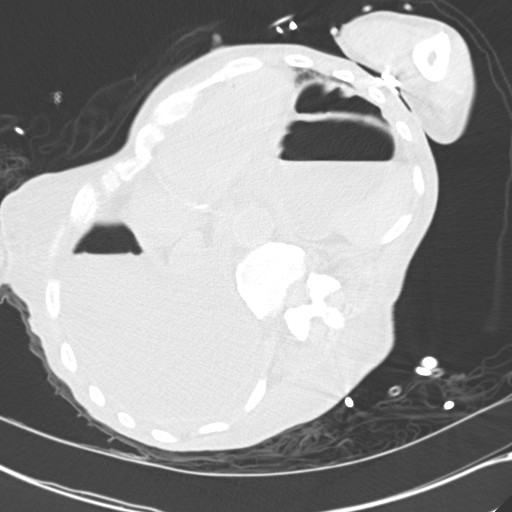
[im 74/82  lung]
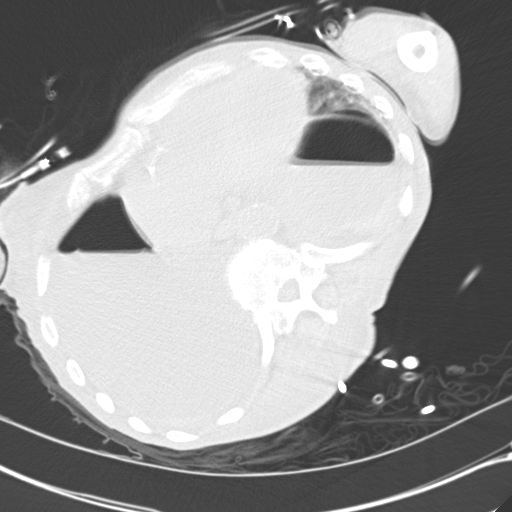
[im 78/82  soft-tissue]
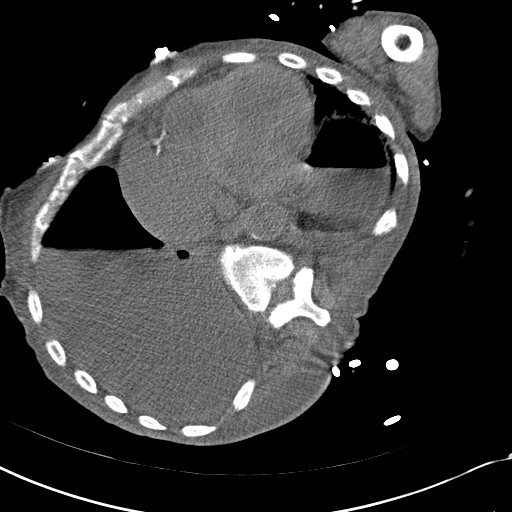
[im 78/82  lung]
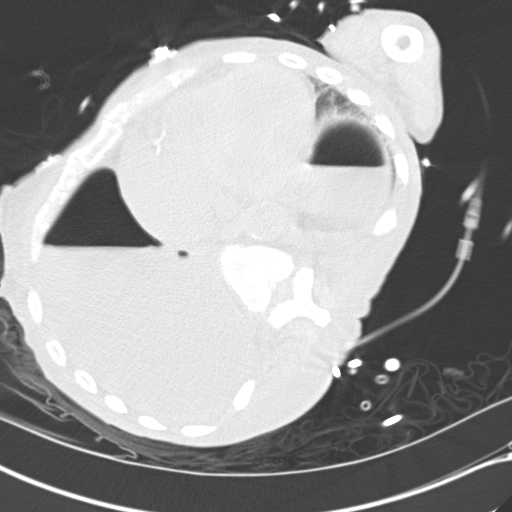

[15 of 32 positions shown; findings below may reference images not displayed]

FINDINGS: The study is somewhat limited due to difficulty positioning the
patient. The patient has a large right pleural effusion and right
pneumothorax. No left effusion is identified. Heart size is
enlarged.

There is diffuse body wall and mesenteric edema. A gastrostomy tube
is in place. No free intraperitoneal air, portal venous gas or
pneumatosis is identified. There is a large volume of stool in the
colon. The stomach and small bowel are unremarkable.

The patient is status post cholecystectomy. The liver, spleen,
adrenal glands and biliary tree are unremarkable. The kidneys appear
atrophic bilaterally. Low attenuating lesions in the left kidney are
likely cysts. Linear high attenuation focus in the right kidney
could be a nonobstructing stone or vascular calcification.

There is marked degenerative disease about the hips and multilevel
lumbar spondylosis. No lytic or sclerotic lesion is identified.
IMPRESSION: Large right hydropneumothorax.

No CT evidence of bowel ischemia is identified.

Anasarca.

Extensive atherosclerosis.

Critical Value/emergent results were called by telephone at the time
of interpretation on 08/26/2015 at [DATE] to Dr. LYNNAE CAESAR , who
verbally acknowledged these results.
